# Patient Record
Sex: Male | Born: 1960
Health system: Southern US, Community
[De-identification: ages and names within clinical notes are randomized; demographics above are authoritative.]

## PROBLEM LIST (undated history)

## (undated) DIAGNOSIS — T7840XA Allergy, unspecified, initial encounter: Secondary | ICD-10-CM

## (undated) DIAGNOSIS — Z87442 Personal history of urinary calculi: Secondary | ICD-10-CM

## (undated) DIAGNOSIS — J45909 Unspecified asthma, uncomplicated: Secondary | ICD-10-CM

## (undated) DIAGNOSIS — S83419A Sprain of medial collateral ligament of unspecified knee, initial encounter: Secondary | ICD-10-CM

## (undated) DIAGNOSIS — K219 Gastro-esophageal reflux disease without esophagitis: Secondary | ICD-10-CM

## (undated) DIAGNOSIS — K429 Umbilical hernia without obstruction or gangrene: Secondary | ICD-10-CM

## (undated) DIAGNOSIS — R7611 Nonspecific reaction to tuberculin skin test without active tuberculosis: Secondary | ICD-10-CM

## (undated) HISTORY — DX: Unspecified asthma, uncomplicated: J45.909

## (undated) HISTORY — DX: Gastro-esophageal reflux disease without esophagitis: K21.9

## (undated) HISTORY — DX: Nonspecific reaction to tuberculin skin test without active tuberculosis: R76.11

## (undated) HISTORY — PX: HERNIA REPAIR: SHX51

## (undated) HISTORY — DX: Personal history of urinary calculi: Z87.442

## (undated) HISTORY — DX: Allergy, unspecified, initial encounter: T78.40XA

---

## 2010-10-22 HISTORY — PX: COLONOSCOPY: SHX174

## 2011-06-22 LAB — HM COLONOSCOPY

## 2016-12-06 ENCOUNTER — Telehealth: Payer: Self-pay | Admitting: Internal Medicine

## 2016-12-06 ENCOUNTER — Encounter: Payer: Self-pay | Admitting: Internal Medicine

## 2016-12-06 ENCOUNTER — Ambulatory Visit (INDEPENDENT_AMBULATORY_CARE_PROVIDER_SITE_OTHER): Payer: 59 | Admitting: Internal Medicine

## 2016-12-06 VITALS — BP 118/64 | HR 61 | Temp 97.7°F | Resp 12 | Ht 68.0 in | Wt 181.1 lb

## 2016-12-06 DIAGNOSIS — Z114 Encounter for screening for human immunodeficiency virus [HIV]: Secondary | ICD-10-CM

## 2016-12-06 DIAGNOSIS — Z1159 Encounter for screening for other viral diseases: Secondary | ICD-10-CM | POA: Diagnosis not present

## 2016-12-06 DIAGNOSIS — K429 Umbilical hernia without obstruction or gangrene: Secondary | ICD-10-CM

## 2016-12-06 DIAGNOSIS — Z0000 Encounter for general adult medical examination without abnormal findings: Secondary | ICD-10-CM

## 2016-12-06 DIAGNOSIS — K219 Gastro-esophageal reflux disease without esophagitis: Secondary | ICD-10-CM

## 2016-12-06 DIAGNOSIS — R1013 Epigastric pain: Secondary | ICD-10-CM

## 2016-12-06 LAB — CBC WITH DIFFERENTIAL/PLATELET
BASOS ABS: 0.1 10*3/uL (ref 0.0–0.1)
Basophils Relative: 1.3 % (ref 0.0–3.0)
Eosinophils Absolute: 0.4 10*3/uL (ref 0.0–0.7)
Eosinophils Relative: 6 % — ABNORMAL HIGH (ref 0.0–5.0)
HEMATOCRIT: 45.3 % (ref 39.0–52.0)
Hemoglobin: 15.6 g/dL (ref 13.0–17.0)
LYMPHS PCT: 28.7 % (ref 12.0–46.0)
Lymphs Abs: 1.9 10*3/uL (ref 0.7–4.0)
MCHC: 34.4 g/dL (ref 30.0–36.0)
MCV: 87.6 fl (ref 78.0–100.0)
MONOS PCT: 7.4 % (ref 3.0–12.0)
Monocytes Absolute: 0.5 10*3/uL (ref 0.1–1.0)
NEUTROS ABS: 3.8 10*3/uL (ref 1.4–7.7)
Neutrophils Relative %: 56.6 % (ref 43.0–77.0)
Platelets: 169 10*3/uL (ref 150.0–400.0)
RBC: 5.18 Mil/uL (ref 4.22–5.81)
RDW: 13.3 % (ref 11.5–15.5)
WBC: 6.6 10*3/uL (ref 4.0–10.5)

## 2016-12-06 LAB — LIPID PANEL
CHOL/HDL RATIO: 6
Cholesterol: 235 mg/dL — ABNORMAL HIGH (ref 0–200)
HDL: 40.2 mg/dL (ref >39.00)
LDL Cholesterol: 157 mg/dL — ABNORMAL HIGH (ref 0–99)
NONHDL: 194.83
TRIGLYCERIDES: 187 mg/dL — AB (ref 0.0–149.0)
VLDL: 37.4 mg/dL (ref 0.0–40.0)

## 2016-12-06 LAB — COMPREHENSIVE METABOLIC PANEL
ALT: 27 U/L (ref 0–53)
AST: 20 U/L (ref 0–37)
Albumin: 4.5 g/dL (ref 3.5–5.2)
Alkaline Phosphatase: 51 U/L (ref 39–117)
BILIRUBIN TOTAL: 1 mg/dL (ref 0.2–1.2)
BUN: 16 mg/dL (ref 6–23)
CALCIUM: 9.5 mg/dL (ref 8.4–10.5)
CO2: 30 meq/L (ref 19–32)
CREATININE: 1.17 mg/dL (ref 0.40–1.50)
Chloride: 104 mEq/L (ref 96–112)
GFR: 68.56 mL/min (ref >60.00)
Glucose, Bld: 97 mg/dL (ref 70–99)
Potassium: 4 mEq/L (ref 3.5–5.1)
Sodium: 139 mEq/L (ref 135–145)
TOTAL PROTEIN: 7.1 g/dL (ref 6.0–8.3)

## 2016-12-06 LAB — HEPATITIS C ANTIBODY: HCV AB: NEGATIVE

## 2016-12-06 LAB — TSH: TSH: 2.68 u[IU]/mL (ref 0.35–4.50)

## 2016-12-06 LAB — HIV ANTIBODY (ROUTINE TESTING W REFLEX): HIV: NONREACTIVE

## 2016-12-06 LAB — PSA: PSA: 1.2 ng/mL (ref 0.10–4.00)

## 2016-12-06 MED ORDER — PANTOPRAZOLE SODIUM 40 MG PO TBEC: 40.0000 mg | DELAYED_RELEASE_TABLET | Freq: Every day | ORAL | 6 refills | Status: DC

## 2016-12-06 MED ORDER — ALBUTEROL SULFATE HFA 108 (90 BASE) MCG/ACT IN AERS: 1.0000 | INHALATION_SPRAY | Freq: Four times a day (QID) | RESPIRATORY_TRACT | 6 refills | Status: DC | PRN

## 2016-12-06 MED ORDER — MONTELUKAST SODIUM 10 MG PO TABS: 10.0000 mg | ORAL_TABLET | Freq: Every evening | ORAL | 6 refills | Status: DC | PRN

## 2016-12-06 NOTE — Progress Notes (Signed)
Pre visit review using our clinic review tool, if applicable. No additional management support is needed unless otherwise documented below in the visit note. 

## 2016-12-06 NOTE — Telephone Encounter (Signed)
Patient stated that he forgot to ask Dr. Larose Kells for a refill of her albuterol and Singular 10mg  at his CPE appointment today. Please advise  Pharmacy: [facility]

## 2016-12-06 NOTE — Patient Instructions (Signed)
GO TO THE LAB : Get the blood work     GO TO THE FRONT DESK Schedule your next appointment for a   checkup in 6 months  Start Protonix 40 mg: 1 tablet before breakfast  Will refer you to a general surgeon  We'll get a ultrasound

## 2016-12-06 NOTE — Assessment & Plan Note (Addendum)
Immunization records:brough documentation to  HR, will call. Had a flu shot  Cscope 2010 wnl per pt  Prostate ca screening-  Last check 2010; DRE wnl today, check a PSA  Labs: CMP, FLP, CBC, TSH, PSA, HIV, hep C He is very active playing soccer-gym. Diet discussed, already improving x few months

## 2016-12-06 NOTE — Progress Notes (Signed)
Subjective:    Patient ID: Cole James, male    DOB: 03/04/1961, 56 y.o.   MRN: [id number]  DOS:  12/06/2016 Type of visit - description : New patient, CPX Interval history: Has a few concerns, see review of systems  Review of Systems  Long history of umbilical hernia, the last few weeks he feels that is a little harder to reduce. Mild discomfort. Would like to see a surgeon Also for a few months, on and off GERD symptoms slightly more noticeable, frequent heartburn. Denies odynophagia, weight loss, blood in the stools. Self started OTC Zantac and Maalox with some improvement. Also, postprandial bloating. Used to have to BMs daily, here lately just having one. Denies actual nausea, vomiting, weight loss, blood in the stools.  Constitutional: No fever. No chills. No unexplained wt changes. No unusual sweats  HEENT: No dental problems, no ear discharge, no facial swelling, no voice changes. No eye discharge, no eye  redness , no  intolerance to light   Respiratory: No wheezing , no  difficulty breathing. No cough , no mucus production  Cardiovascular: No CP, no leg swelling , no  Palpitations  GI: see above   Endocrine: No polyphagia, no polyuria , no polydipsia  GU: No dysuria, gross hematuria, difficulty urinating. No urinary urgency, no frequency.  Musculoskeletal: No joint swellings or unusual aches or pains  Skin: No change in the color of the skin, palor , no  Rash  Allergic, immunologic: No environmental allergies , no  food allergies  Neurological: No dizziness no  syncope. No headaches. No diplopia, no slurred, no slurred speech, no motor deficits, no facial  Numbness  Hematological: No enlarged lymph nodes, no easy bruising , no unusual bleedings  Psychiatry: No suicidal ideas, no hallucinations, no beavior problems, no confusion.  No unusual/severe anxiety, no depression    Past Medical History:  Diagnosis Date  . Allergy    pet dander, seasonal  .  Asthma    childhood  . GERD (gastroesophageal reflux disease)   . History of kidney stones    age 20  . 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 HERNIA REPAIR     inguinal, age 6   Family History  Problem Relation Age of Onset  . Hypertension Father   . Prostate cancer Maternal Grandfather   . Cancer Paternal Grandfather     cholangio Ca  . Colon cancer Neg Hx   . Diabetes Neg Hx   . CAD Neg Hx     Social History   Social History  . Marital status: Married    Spouse name: N/A  . Number of children: 2  . Years of education: N/A   Occupational History  . phyisician    Social History Main Topics  . Smoking status: Never Smoker  . Smokeless tobacco: Never Used  . Alcohol use Yes     Comment: Social  . Drug use: No  . Sexual activity: Not on file   Other Topics Concern  . Not on file   Social History Narrative   Born in [place], live in [place] since childhood   Moved to [place] 2017   Son 28   Daughter 57, [place] living w/ him   Soccer player       Allergies as of 12/06/2016   No Known Allergies     Medication List  Accurate as of 12/06/16 11:59 PM. Always use your most recent med list.          albuterol 108 (90 Base) MCG/ACT inhaler Commonly known as:  PROVENTIL HFA;VENTOLIN HFA Inhale 1-2 puffs into the lungs every 6 (six) hours as needed for wheezing or shortness of breath.   fexofenadine 30 MG tablet Commonly known as:  ALLEGRA Take 30 mg by mouth as needed.   montelukast 10 MG tablet Commonly known as:  SINGULAIR Take 1 tablet (10 mg total) by mouth at bedtime as needed.   pantoprazole 40 MG tablet Commonly known as:  PROTONIX Take 1 tablet (40 mg total) by mouth daily.          Objective:   Physical Exam BP 118/64 (BP Location: Right Arm, Patient Position: Sitting, Cuff Size: Normal)   Pulse 61   Temp 97.7 F (36.5 C) (Oral)   Resp 12   Ht 5\' 8"  (1.727 m)   Wt 181 lb 2  oz (82.2 kg)   SpO2 94%   BMI 27.54 kg/m   General:   Well developed, well nourished . NAD.  Neck: No  thyromegaly  HEENT:  Normocephalic . Face symmetric, atraumatic Lungs:  CTA B Normal respiratory effort, no intercostal retractions, no accessory muscle use. Heart: RRR,  no murmur.  No pretibial edema bilaterally  Abdomen:  Not distended, soft, non-tender. No rebound or rigidity. Has a 123456 inches umbilical hernia, mild, reducible, nontender. Rectal:  External abnormalities: none. Normal sphincter tone. No rectal masses or tenderness.  Stool brown  Prostate: Prostate gland firm and smooth, no enlargement; R side w/ a slt prominent proximal lobe but no mass or nodularity; no  tenderness, mass, asymmetry or induration.  Skin: Exposed areas without rash. Not pale. Not jaundice Neurologic:  alert & oriented X3.  Speech normal, gait appropriate for age and unassisted Strength symmetric and appropriate for age.  Psych: Cognition and judgment appear intact.  Cooperative with normal attention span and concentration.  Behavior appropriate. No anxious or depressed appearing.    Assessment & Plan:   Assessment GERD Asthma-mild , worse as a child, cold induced  Allergies  H/o +PPD H/o urolithiasis , age 70 2010--->  CP, elevated BP-- r/o MI, cardiac cath (-)  PLAN: GERD: On and off, worse lately, on Zantac and Maalox, already helping to some extent. Plan: Protonix, reassess 6 months Small umbilical hernia: Refer to surgery for further eval Dyspepsia: Postprandial bloating, could be GERD related or GB? , less likely d/t umbilical hernia. Will check a abdominal ultrasound RTC 6 months

## 2016-12-06 NOTE — Telephone Encounter (Signed)
Rxs sent

## 2016-12-07 DIAGNOSIS — K219 Gastro-esophageal reflux disease without esophagitis: Secondary | ICD-10-CM | POA: Insufficient documentation

## 2016-12-07 DIAGNOSIS — Z09 Encounter for follow-up examination after completed treatment for conditions other than malignant neoplasm: Secondary | ICD-10-CM | POA: Insufficient documentation

## 2016-12-07 MED FILL — VENTOLIN HFA 90 MCG INHALER: 108 (90 BAS | 25 days supply | Qty: 18 | Fill #0

## 2016-12-07 MED FILL — MONTELUKAST SOD 10 MG TAB: 10 | 90 days supply | Qty: 90 | Fill #0

## 2016-12-07 MED FILL — PANTOPRAZOLE SOD DR 40 MG T: 40 | 90 days supply | Qty: 90 | Fill #0

## 2016-12-07 NOTE — Assessment & Plan Note (Signed)
GERD: On and off, worse lately, on Zantac and Maalox, already helping to some extent. Plan: Protonix, reassess 6 months Small umbilical hernia: Refer to surgery for further eval Dyspepsia: Postprandial bloating, could be GERD related or GB? , less likely d/t umbilical hernia. Will check a abdominal ultrasound RTC 6 months

## 2016-12-11 ENCOUNTER — Ambulatory Visit (HOSPITAL_COMMUNITY): Admission: RE | Admit: 2016-12-11 | Payer: 59 | Source: Ambulatory Visit

## 2016-12-13 ENCOUNTER — Ambulatory Visit (HOSPITAL_COMMUNITY)
Admission: RE | Admit: 2016-12-13 | Discharge: 2016-12-13 | Disposition: A | Discharge status: Home / Self Care | Payer: 59 | Source: Ambulatory Visit | Attending: Internal Medicine | Admitting: Internal Medicine

## 2016-12-13 ENCOUNTER — Telehealth: Payer: Self-pay | Admitting: Internal Medicine

## 2016-12-13 DIAGNOSIS — R1013 Epigastric pain: Secondary | ICD-10-CM | POA: Diagnosis not present

## 2016-12-13 DIAGNOSIS — K30 Functional dyspepsia: Secondary | ICD-10-CM | POA: Diagnosis not present

## 2016-12-13 NOTE — Telephone Encounter (Signed)
Please advise? I see where US abdomen was completed.

## 2016-12-13 NOTE — Telephone Encounter (Signed)
Left a detailed message: Ultrasound was done for dyspepsia, it is essentially normal. Further evaluation of the hernia would be conducted by the surgeon throughout physical exam and if needed they could consider a CT.

## 2016-12-13 NOTE — Telephone Encounter (Signed)
Pt said that he was suppose to have a ultra sound at  [facility] for having a hernia. Pt says that they couldn't complete a ultrasound for the umbilical area because orders weren't placed. Pt would like to have those specific orders placed.     CB: [phone]

## 2016-12-19 ENCOUNTER — Ambulatory Visit: Payer: Self-pay | Admitting: General Surgery

## 2016-12-19 DIAGNOSIS — K429 Umbilical hernia without obstruction or gangrene: Secondary | ICD-10-CM | POA: Diagnosis not present

## 2016-12-19 NOTE — H&P (Signed)
Cole James 12/19/2016 10:36 AM Location: [facility] Patient #: [id number] DOB: [birth year] Married / Language: English / Race: Refused to Report/Unreported Male  History of Present Illness Cole Hollingshead MD; 12/19/2016 11:02 AM) The patient is a 56 year old male.   Note:He is referred by Dr.Paz for evaluation of a symptomatic umbilical hernia. He noted it for about one year but recently has become more symptomatic with respecttodiscomfort. He states in the past he felt a tearing like sensation. He also has noted more gas and borborygmi. He does have a history of gastroesophageal reflux disease. He states when he drinks a carbonated beverage and belches that helps the gas. He is also been placed on Protonix which helps some of this as well. He had a history of an inguinal hernia repair done many years ago with mesh.  No known bleeding disorders. No history in his family of a adverse anesthesia reaction.  Diagnostic Studies History Cole James, [place]; 12/19/2016 10:36 AM) Colonoscopy 5-10 years ago  Allergies Cole James, RMA; 12/19/2016 10:36 AM) No Known Allergies 12/19/2016  Medication History Cole James, [place]; 12/19/2016 10:37 AM) Pantoprazole Sodium (40MG  Tablet DR, Oral) Active. Albuterol (90MCG/ACT Aerosol Soln, Inhalation) Active. Allegra (30MG  Tablet, Oral) Active. Singulair (10MG  Tablet, Oral) Active. Medications Reconciled  Social History Cole James, [place]; 12/19/2016 10:36 AM) Alcohol use Occasional alcohol use. Caffeine use Coffee. No drug use Tobacco use Never smoker.  Family History Cole James, [place]; 12/19/2016 10:36 AM) Cancer Family Members In General. Hypertension Brother, Father. Prostate Cancer Family Members In General.  Other Problems Cole James, RMA; 12/19/2016 10:36 AM) Gastroesophageal Reflux Disease Hypercholesterolemia     Review of Systems Cole James RMA; 12/19/2016  10:36 AM) General Not Present- Appetite Loss, Chills, Fatigue, Fever, Night Sweats, Weight Gain and Weight Loss. Skin Not Present- Change in Wart/Mole, Dryness, Hives, Jaundice, New Lesions, Non-Healing Wounds, Rash and Ulcer. HEENT Present- Seasonal Allergies. Not Present- Earache, Hearing Loss, Hoarseness, Nose Bleed, Oral Ulcers, Ringing in the Ears, Sinus Pain, Sore Throat, Visual Disturbances, Wears glasses/contact lenses and Yellow Eyes. Respiratory Present- Snoring. Not Present- Bloody sputum, Chronic Cough, Difficulty Breathing and Wheezing. Breast Not Present- Breast Mass, Breast Pain, Nipple Discharge and Skin Changes. Cardiovascular Not Present- Chest Pain, Difficulty Breathing Lying Down, Leg Cramps, Palpitations, Rapid Heart Rate, Shortness of Breath and Swelling of Extremities. Gastrointestinal Present- Abdominal Pain and Change in Bowel Habits. Not Present- Bloating, Bloody Stool, Chronic diarrhea, Constipation, Difficulty Swallowing, Excessive gas, Gets full quickly at meals, Hemorrhoids, Indigestion, Nausea, Rectal Pain and Vomiting. Male Genitourinary Not Present- Blood in Urine, Change in Urinary Stream, Frequency, Impotence, Nocturia, Painful Urination, Urgency and Urine Leakage. Musculoskeletal Not Present- Back Pain, Joint Pain, Joint Stiffness, Muscle Pain, Muscle Weakness and Swelling of Extremities. Neurological Not Present- Decreased Memory, Fainting, Headaches, Numbness, Seizures, Tingling, Tremor, Trouble walking and Weakness. Psychiatric Not Present- Anxiety, Bipolar, Change in Sleep Pattern, Depression, Fearful and Frequent crying. Endocrine Not Present- Cold Intolerance, Excessive Hunger, Hair Changes, Heat Intolerance, Hot flashes and New Diabetes. Hematology Not Present- Blood Thinners, Easy Bruising, Excessive bleeding, Gland problems, HIV and Persistent Infections.  Vitals Cole James RMA; 12/19/2016 10:38 AM) 12/19/2016 10:37 AM Weight: 186.8 lb Height:  68in Body Surface Area: 1.99 m Body Mass Index: 28.4 kg/m  Temp.: 98.61F  Pulse: 65 (Regular)  BP: 118/70 (Sitting, Left Arm, Standard)      Physical Exam Cole Hollingshead MD; 12/19/2016 11:03 AM)  The physical exam findings are as follows: Note:GENERAL APPEARANCE: Overweight male in  NAD. Pleasant and cooperative.  EARS, NOSE, MOUTH THROAT: [place]/AT external ears: no lesions or deformities external nose: no lesions or deformities hearing: grossly normal lips: moist, no deformities EYES external: conjunctiva, lids, sclerae normal pupils: equal, round glasses: no  CV ascultation: RRR, no murmur extremity edema: no  RESP auscultation: breath sounds equal and clear respiratory effort: normal  GASTROINTESTINAL abdomen: Soft, non-tender, non-distended, no masses liver and spleen: not enlarged. hernia: Supraumbilical, reducible, 1.5 cm fascial defect scar: none present  MUSCULOSKELETAL station and gait: normal digits/nails: no clubbing or cyanosis instability: none  NEUROLOGIC speech: normal  PSYCHIATRIC alertness and orientation: normal mood/affect/behavior: normal judgement and insight: normal    Assessment & Plan Cole Hollingshead MD; [id number] AM)  UMBILICAL HERNIA WITHOUT OBSTRUCTION OR GANGRENE (K42.9) Impression: The hernia is symptomatic. He is having to manually reduce it.  Plan: I recommended open umbilical hernia repair with mesh. I have discussed the procedure, risks, and aftercare. Risks include but are not limited to bleeding, infection, wound healing problems, anesthesia, recurrence, injury to intra-abdominal organs. He seems to understand and would like to proceed.  [name], M.D.

## 2017-03-29 ENCOUNTER — Ambulatory Visit: Payer: Self-pay | Admitting: General Surgery

## 2017-04-04 ENCOUNTER — Encounter (HOSPITAL_BASED_OUTPATIENT_CLINIC_OR_DEPARTMENT_OTHER): Payer: Self-pay | Admitting: *Deleted

## 2017-04-04 ENCOUNTER — Ambulatory Visit: Payer: Self-pay

## 2017-04-04 ENCOUNTER — Ambulatory Visit (INDEPENDENT_AMBULATORY_CARE_PROVIDER_SITE_OTHER): Payer: 59 | Admitting: Family Medicine

## 2017-04-04 ENCOUNTER — Encounter: Payer: Self-pay | Admitting: Family Medicine

## 2017-04-04 VITALS — BP 126/84 | HR 62

## 2017-04-04 DIAGNOSIS — S83412A Sprain of medial collateral ligament of left knee, initial encounter: Secondary | ICD-10-CM | POA: Diagnosis not present

## 2017-04-04 DIAGNOSIS — M25562 Pain in left knee: Secondary | ICD-10-CM

## 2017-04-04 NOTE — Progress Notes (Signed)
[facility] [address] [place], [place] [address] Phone: [phone] Subjective:    I'm seeing this patient by the request  of:  Colon Branch, MD   CC: Knee pain  ATF:[id number]  ROBBI SCURLOCK is a 56 y.o. male coming in with complaint of knee pain. Left-sided Has had difficulty with his knees previously. Avid soccer player. Patient states was going down the stairs last week. Patient states that there is an audible pop. Next a had significant swelling. Describes the pain as more of a dull, throbbing aching pain. Has noticed lack of range of motion. Patient recently severity pain is 7 out of 10.     Past Medical History:  Diagnosis Date  . Allergy    pet dander, seasonal  . Asthma    childhood  . GERD (gastroesophageal reflux disease)   . History of kidney stones    age 67  . 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 HERNIA REPAIR     inguinal, age 67   Social History   Social History  . Marital status: Married    Spouse name: N/A  . Number of children: 2  . Years of education: N/A   Occupational History  . phyisician    Social History Main Topics  . Smoking status: Never Smoker  . Smokeless tobacco: Never Used  . Alcohol use Yes     Comment: Social  . Drug use: No  . Sexual activity: Not Asked   Other Topics Concern  . None   Social History Narrative   Born in [place], live in [place] since childhood   Moved to [place] 2017   Son 28   Daughter 71, [place] living w/ him   Soccer player    No Known Allergies Family History  Problem Relation Age of Onset  . Hypertension Father   . Prostate cancer Maternal Grandfather   . Cancer Paternal Grandfather        cholangio Ca  . Colon cancer Neg Hx   . Diabetes Neg Hx   . CAD Neg Hx     Past medical history, social, surgical and family history all reviewed in electronic medical record.  No pertanent information unless stated  regarding to the chief complaint.   Review of Systems:Review of systems updated and as accurate as of 04/04/17  No headache, visual changes, nausea, vomiting, diarrhea, constipation, dizziness, abdominal pain, skin rash, fevers, chills, night sweats, weight loss, swollen lymph nodes, body aches, muscle aches, chest pain, shortness of breath, mood changes. Positive joint swelling  Objective  Blood pressure 126/84, pulse 62, SpO2 94 %. Systems examined below as of 04/04/17   General: No apparent distress alert and oriented x3 mood and affect normal, dressed appropriately.  HEENT: Pupils equal, extraocular movements intact  Respiratory: Patient's speak in full sentences and does not appear short of breath  Cardiovascular: No lower extremity edema, non tender, no erythema  Skin: Warm dry intact with no signs of infection or rash on extremities or on axial skeleton.  Abdomen: Soft nontender  Neuro: Cranial nerves II through XII are intact, neurovascularly intact in all extremities with 2+ DTRs and 2+ pulses.  Lymph: No lymphadenopathy of posterior or anterior cervical chain or axillae bilaterally.  Gait Mild antalgic gait MSK:  Non tender with full range of motion and good stability and symmetric strength and tone of  shoulders, elbows, wrist, hip, and ankles bilaterally.  Knee: Left Effusion noted Diffuse tenderness Lacks last 15 of flexion More pain with stressing the MCL Negative Mcmurray's, Apley's, and Thessalonian tests. Non painful patellar compression. Patellar glide without crepitus. Patellar and quadriceps tendons unremarkable. Hamstring and quadriceps strength is normal.  Contralateral knee unremarkable  MSK US performed of: Left This study was ordered, performed, and interpreted by Charlann Boxer D.O.  Knee: Large effusion noted Anteromedial, anterolateral, posteromedial, and posterolateral menisci unremarkable without tearing, fraying, effusion, or displacement. Patellar  Tendon unremarkable on long and transverse views without effusion. No abnormality of prepatellar bursa. MCL does have a greater than 50% tear off of the femoral component  IMPRESSION:  Partial possible high-grade tear of the MCL with effusion  Procedure: Real-time Ultrasound Guided Injection of left knee Device: GE Logiq Q7 Ultrasound guided injection is preferred based studies that show increased duration, increased effect, greater accuracy, decreased procedural pain, increased response rate, and decreased cost with ultrasound guided versus blind injection.  Verbal informed consent obtained.  Time-out conducted.  Noted no overlying erythema, induration, or other signs of local infection.  Skin prepped in a sterile fashion.  Local anesthesia: Topical Ethyl chloride.  With sterile technique and under real time ultrasound guidance: With a 22-gauge 2 inch needle patient was injected with 4 cc  0.5% Marcaine then aspirated 35 mL of strawlike fluid and injected 1 cc of Kenalog 40 mg/dL. This was from a superior lateral approach.  Completed without difficulty  Pain immediately resolved suggesting accurate placement of the medication.  Advised to call if fevers/chills, erythema, induration, drainage, or persistent bleeding.  Images permanently stored and available for review in the ultrasound unit.  Impression: Technically successful ultrasound guided injection.    Impression and Recommendations:     This case required medical decision making of moderate complexity.      Note: This dictation was prepared with Dragon dictation along with smaller phrase technology. Any transcriptional errors that result from this process are unintentional.

## 2017-04-04 NOTE — Patient Instructions (Addendum)
Good to see you.  Ice 20 minutes 2 times daily. Usually after activity and before bed. pennsaid pinkie amount topically 2 times daily as needed.  Wear brace daily for 2 weeks.  Do not wear it at night Give em an update on Monday by text  Likely will take 4-6 weeks. We will need to look at it again when you feel good after your surgery  [phone]

## 2017-04-04 NOTE — Progress Notes (Signed)
Pt instructed npo p mn 6/20 x allergy meds w sip of water if needed.  To [facility] 6/21 @ 0930.  hgb on arrival.  Pt instructed to do hibiclens shower hs and am of surgery.  Pt verbalized his under standing.

## 2017-04-04 NOTE — Assessment & Plan Note (Signed)
Partial tear noted. Patient did have a large effusion. Brace given. Discussed icing regimen and given topical anti-inflammatories. Patient is going to be having a hernia repair in the near future so we'll not start exercises until he is feeling better. Follow-up again with me in 3-4 weeks.

## 2017-04-11 ENCOUNTER — Ambulatory Visit (HOSPITAL_BASED_OUTPATIENT_CLINIC_OR_DEPARTMENT_OTHER)
Admission: RE | Admit: 2017-04-11 | Discharge: 2017-04-11 | Disposition: A | Discharge status: Home / Self Care | Payer: 59 | Source: Ambulatory Visit | Attending: General Surgery | Admitting: General Surgery

## 2017-04-11 ENCOUNTER — Ambulatory Visit (HOSPITAL_BASED_OUTPATIENT_CLINIC_OR_DEPARTMENT_OTHER): Payer: 59 | Admitting: Anesthesiology

## 2017-04-11 ENCOUNTER — Encounter (HOSPITAL_BASED_OUTPATIENT_CLINIC_OR_DEPARTMENT_OTHER): Payer: Self-pay | Admitting: *Deleted

## 2017-04-11 ENCOUNTER — Encounter (HOSPITAL_BASED_OUTPATIENT_CLINIC_OR_DEPARTMENT_OTHER)
Admission: RE | Disposition: A | Discharge status: Home / Self Care | Payer: Self-pay | Source: Ambulatory Visit | Attending: General Surgery

## 2017-04-11 DIAGNOSIS — Z8042 Family history of malignant neoplasm of prostate: Secondary | ICD-10-CM | POA: Insufficient documentation

## 2017-04-11 DIAGNOSIS — R7611 Nonspecific reaction to tuberculin skin test without active tuberculosis: Secondary | ICD-10-CM | POA: Diagnosis not present

## 2017-04-11 DIAGNOSIS — Z91040 Latex allergy status: Secondary | ICD-10-CM | POA: Insufficient documentation

## 2017-04-11 DIAGNOSIS — Z79899 Other long term (current) drug therapy: Secondary | ICD-10-CM | POA: Insufficient documentation

## 2017-04-11 DIAGNOSIS — J3081 Allergic rhinitis due to animal (cat) (dog) hair and dander: Secondary | ICD-10-CM | POA: Insufficient documentation

## 2017-04-11 DIAGNOSIS — K429 Umbilical hernia without obstruction or gangrene: Secondary | ICD-10-CM | POA: Diagnosis not present

## 2017-04-11 DIAGNOSIS — K219 Gastro-esophageal reflux disease without esophagitis: Secondary | ICD-10-CM | POA: Diagnosis not present

## 2017-04-11 DIAGNOSIS — Z87442 Personal history of urinary calculi: Secondary | ICD-10-CM | POA: Diagnosis not present

## 2017-04-11 DIAGNOSIS — Z8249 Family history of ischemic heart disease and other diseases of the circulatory system: Secondary | ICD-10-CM | POA: Insufficient documentation

## 2017-04-11 DIAGNOSIS — Z800 Family history of malignant neoplasm of digestive organs: Secondary | ICD-10-CM | POA: Insufficient documentation

## 2017-04-11 HISTORY — PX: INSERTION OF MESH: SHX5868

## 2017-04-11 HISTORY — PX: UMBILICAL HERNIA REPAIR: SHX196

## 2017-04-11 HISTORY — DX: Umbilical hernia without obstruction or gangrene: K42.9

## 2017-04-11 HISTORY — DX: Sprain of medial collateral ligament of unspecified knee, initial encounter: S83.419A

## 2017-04-11 LAB — POCT HEMOGLOBIN-HEMACUE: Hemoglobin: 15.9 g/dL (ref 13.0–17.0)

## 2017-04-11 SURGERY — REPAIR, HERNIA, UMBILICAL, ADULT
Anesthesia: General

## 2017-04-11 MED ORDER — FENTANYL CITRATE (PF) 100 MCG/2ML IJ SOLN
INTRAMUSCULAR | Status: AC
  Filled 2017-04-11: qty 2

## 2017-04-11 MED ORDER — LACTATED RINGERS IV SOLN
INTRAVENOUS | Status: DC
  Administered 2017-04-11 (×2): via INTRAVENOUS
  Filled 2017-04-11: qty 1000

## 2017-04-11 MED ORDER — SUGAMMADEX SODIUM 200 MG/2ML IV SOLN
INTRAVENOUS | Status: DC | PRN
  Administered 2017-04-11: 200 mg via INTRAVENOUS

## 2017-04-11 MED ORDER — DEXAMETHASONE SODIUM PHOSPHATE 10 MG/ML IJ SOLN
INTRAMUSCULAR | Status: AC
  Filled 2017-04-11: qty 1

## 2017-04-11 MED ORDER — SUGAMMADEX SODIUM 200 MG/2ML IV SOLN
INTRAVENOUS | Status: AC
  Filled 2017-04-11: qty 2

## 2017-04-11 MED ORDER — KETOROLAC TROMETHAMINE 30 MG/ML IJ SOLN
INTRAMUSCULAR | Status: DC | PRN
  Administered 2017-04-11: 30 mg via INTRAVENOUS

## 2017-04-11 MED ORDER — LIDOCAINE HCL (CARDIAC) 20 MG/ML IV SOLN
INTRAVENOUS | Status: DC | PRN
  Administered 2017-04-11: 100 mg via INTRAVENOUS

## 2017-04-11 MED ORDER — OXYCODONE HCL 5 MG PO TABS
ORAL_TABLET | ORAL | Status: AC
  Filled 2017-04-11: qty 1

## 2017-04-11 MED ORDER — CHLORHEXIDINE GLUCONATE CLOTH 2 % EX PADS
6.0000 | MEDICATED_PAD | Freq: Once | CUTANEOUS | Status: DC
  Filled 2017-04-11: qty 6

## 2017-04-11 MED ORDER — CEFAZOLIN SODIUM-DEXTROSE 2-4 GM/100ML-% IV SOLN
INTRAVENOUS | Status: AC
Start: 2017-04-11 — End: 2017-04-11
  Filled 2017-04-11: qty 100

## 2017-04-11 MED ORDER — PROPOFOL 10 MG/ML IV BOLUS
INTRAVENOUS | Status: AC
  Filled 2017-04-11: qty 40

## 2017-04-11 MED ORDER — BUPIVACAINE HCL (PF) 0.25 % IJ SOLN
INTRAMUSCULAR | Status: DC | PRN
  Administered 2017-04-11: 16 mL

## 2017-04-11 MED ORDER — OXYCODONE HCL 5 MG PO TABS: 5.0000 mg | ORAL_TABLET | ORAL | 0 refills | Status: DC | PRN

## 2017-04-11 MED ORDER — FENTANYL CITRATE (PF) 100 MCG/2ML IJ SOLN
25.0000 ug | INTRAMUSCULAR | Status: DC | PRN
  Filled 2017-04-11: qty 1

## 2017-04-11 MED ORDER — LACTATED RINGERS IV SOLN
INTRAVENOUS | Status: DC
  Filled 2017-04-11: qty 1000

## 2017-04-11 MED ORDER — ROCURONIUM BROMIDE 50 MG/5ML IV SOSY
PREFILLED_SYRINGE | INTRAVENOUS | Status: AC
  Filled 2017-04-11: qty 5

## 2017-04-11 MED ORDER — CEFAZOLIN SODIUM-DEXTROSE 2-4 GM/100ML-% IV SOLN
2.0000 g | INTRAVENOUS | Status: AC
  Administered 2017-04-11: 2 g via INTRAVENOUS
  Filled 2017-04-11: qty 100

## 2017-04-11 MED ORDER — DEXAMETHASONE SODIUM PHOSPHATE 4 MG/ML IJ SOLN
INTRAMUSCULAR | Status: DC | PRN
  Administered 2017-04-11: 10 mg via INTRAVENOUS

## 2017-04-11 MED ORDER — ACETAMINOPHEN 325 MG PO TABS
650.0000 mg | ORAL_TABLET | ORAL | Status: DC | PRN
  Filled 2017-04-11: qty 2

## 2017-04-11 MED ORDER — ACETAMINOPHEN 650 MG RE SUPP
650.0000 mg | RECTAL | Status: DC | PRN
  Filled 2017-04-11: qty 1

## 2017-04-11 MED ORDER — OXYCODONE HCL 5 MG PO TABS
5.0000 mg | ORAL_TABLET | ORAL | Status: DC | PRN
  Administered 2017-04-11: 5 mg via ORAL
  Filled 2017-04-11: qty 2

## 2017-04-11 MED ORDER — ONDANSETRON HCL 4 MG/2ML IJ SOLN
INTRAMUSCULAR | Status: DC | PRN
  Administered 2017-04-11: 4 mg via INTRAVENOUS

## 2017-04-11 MED ORDER — LIDOCAINE 2% (20 MG/ML) 5 ML SYRINGE
INTRAMUSCULAR | Status: AC
Start: 2017-04-11 — End: 2017-04-11
  Filled 2017-04-11: qty 5

## 2017-04-11 MED ORDER — METOCLOPRAMIDE HCL 5 MG/ML IJ SOLN
10.0000 mg | Freq: Once | INTRAMUSCULAR | Status: DC | PRN
  Filled 2017-04-11: qty 2

## 2017-04-11 MED ORDER — PROPOFOL 10 MG/ML IV BOLUS
INTRAVENOUS | Status: DC | PRN
  Administered 2017-04-11: 180 mg via INTRAVENOUS

## 2017-04-11 MED ORDER — MIDAZOLAM HCL 2 MG/2ML IJ SOLN
INTRAMUSCULAR | Status: AC
  Filled 2017-04-11: qty 2

## 2017-04-11 MED ORDER — SODIUM CHLORIDE 0.9% FLUSH
3.0000 mL | INTRAVENOUS | Status: DC | PRN
  Filled 2017-04-11: qty 3

## 2017-04-11 MED ORDER — FENTANYL CITRATE (PF) 100 MCG/2ML IJ SOLN
INTRAMUSCULAR | Status: DC | PRN
Start: 2017-04-11 — End: 2017-04-11
  Administered 2017-04-11 (×3): 25 ug via INTRAVENOUS
  Administered 2017-04-11: 50 ug via INTRAVENOUS
  Administered 2017-04-11 (×3): 25 ug via INTRAVENOUS

## 2017-04-11 MED ORDER — MIDAZOLAM HCL 5 MG/5ML IJ SOLN
INTRAMUSCULAR | Status: DC | PRN
  Administered 2017-04-11: 2 mg via INTRAVENOUS

## 2017-04-11 MED ORDER — ROCURONIUM BROMIDE 100 MG/10ML IV SOLN
INTRAVENOUS | Status: DC | PRN
  Administered 2017-04-11: 50 mg via INTRAVENOUS

## 2017-04-11 MED ORDER — MORPHINE SULFATE (PF) 2 MG/ML IV SOLN
2.0000 mg | INTRAVENOUS | Status: DC | PRN
  Filled 2017-04-11: qty 2

## 2017-04-11 MED ORDER — ONDANSETRON HCL 4 MG/2ML IJ SOLN
INTRAMUSCULAR | Status: AC
  Filled 2017-04-11: qty 2

## 2017-04-11 MED ORDER — MEPERIDINE HCL 25 MG/ML IJ SOLN
6.2500 mg | INTRAMUSCULAR | Status: DC | PRN
  Filled 2017-04-11: qty 1

## 2017-04-11 MED FILL — oxyCODONE HCL 5 MG TABS: 5 | 3 days supply | Qty: 40 | Fill #0

## 2017-04-11 SURGICAL SUPPLY — 53 items
APPLICATOR COTTON TIP 6IN STRL (MISCELLANEOUS) IMPLANT
BENZOIN TINCTURE PRP APPL 2/3 (GAUZE/BANDAGES/DRESSINGS) ×2 IMPLANT
BLADE CLIPPER SURG (BLADE) ×2 IMPLANT
BLADE SURG 10 STRL SS (BLADE) IMPLANT
BLADE SURG 15 STRL LF DISP TIS (BLADE) ×1 IMPLANT
BLADE SURG 15 STRL SS (BLADE) ×1
CANISTER SUCTION 1200CC (MISCELLANEOUS) IMPLANT
CLEANER CAUTERY TIP 5X5 PAD (MISCELLANEOUS) ×1 IMPLANT
CLOTH BEACON ORANGE TIMEOUT ST (SAFETY) ×2 IMPLANT
COVER BACK TABLE 60X90IN (DRAPES) ×2 IMPLANT
COVER MAYO STAND STRL (DRAPES) ×2 IMPLANT
DISSECTOR ROUND CHERRY 3/8 STR (MISCELLANEOUS) IMPLANT
DRAIN PENROSE 18X1/2 LTX STRL (DRAIN) IMPLANT
DRAPE INCISE IOBAN 66X45 STRL (DRAPES) ×2 IMPLANT
DRAPE LAPAROTOMY 100X72 PEDS (DRAPES) ×2 IMPLANT
DRESSING TELFA ISLAND 4X8 (GAUZE/BANDAGES/DRESSINGS) ×2 IMPLANT
DRSG TEGADERM 4X4.75 (GAUZE/BANDAGES/DRESSINGS) ×2 IMPLANT
DRSG TELFA 3X8 NADH (GAUZE/BANDAGES/DRESSINGS) ×2 IMPLANT
ELECT REM PT RETURN 9FT ADLT (ELECTROSURGICAL) ×2
ELECTRODE REM PT RTRN 9FT ADLT (ELECTROSURGICAL) ×1 IMPLANT
GAUZE SPONGE 4X4 12PLY STRL LF (GAUZE/BANDAGES/DRESSINGS) ×2 IMPLANT
GLOVE ECLIPSE 8.0 STRL XLNG CF (GLOVE) ×2 IMPLANT
GLOVE INDICATOR 8.0 STRL GRN (GLOVE) ×2 IMPLANT
GOWN W/2 COTTON TOWELS 2 STD (GOWNS) ×2 IMPLANT
KIT RM TURNOVER CYSTO AR (KITS) ×2 IMPLANT
MANIFOLD NEPTUNE II (INSTRUMENTS) IMPLANT
MESH HERNIA 3X6 (Mesh General) ×2 IMPLANT
NDL SUT 6 .5 CRC .975X.05 MAYO (NEEDLE) IMPLANT
NEEDLE HYPO 25X1 1.5 SAFETY (NEEDLE) ×2 IMPLANT
NEEDLE MAYO TAPER (NEEDLE)
NOVOVIL 0 ×2 IMPLANT
NS IRRIG 500ML POUR BTL (IV SOLUTION) ×2 IMPLANT
PACK BASIN DAY SURGERY FS (CUSTOM PROCEDURE TRAY) IMPLANT
PAD CLEANER CAUTERY TIP 5X5 (MISCELLANEOUS) ×1
PAD PREP 24X48 CUFFED NSTRL (MISCELLANEOUS) IMPLANT
PENCIL BUTTON HOLSTER BLD 10FT (ELECTRODE) ×2 IMPLANT
STRIP CLOSURE SKIN 1/2X4 (GAUZE/BANDAGES/DRESSINGS) ×2 IMPLANT
SUT MON AB 4-0 PC3 18 (SUTURE) ×2 IMPLANT
SUT NOVA 0 T19/GS 22DT (SUTURE) ×2 IMPLANT
SUT PROLENE 0 CT 1 30 (SUTURE) ×2 IMPLANT
SUT PROLENE 2 0 CT2 30 (SUTURE) IMPLANT
SUT SURG 0 T 19/GS 22 1969 62 (SUTURE) IMPLANT
SUT VIC AB 2-0 SH 27 (SUTURE) ×1
SUT VIC AB 2-0 SH 27XBRD (SUTURE) ×1 IMPLANT
SUT VIC AB 3-0 SH 27 (SUTURE) ×1
SUT VIC AB 3-0 SH 27X BRD (SUTURE) ×1 IMPLANT
SYR CONTROL 10ML LL (SYRINGE) ×2 IMPLANT
TAPE STRIPS DRAPE STRL (GAUZE/BANDAGES/DRESSINGS) ×2 IMPLANT
TOWEL OR 17X24 6PK STRL BLUE (TOWEL DISPOSABLE) ×4 IMPLANT
TRAY DSU PREP LF (CUSTOM PROCEDURE TRAY) IMPLANT
TUBE CONNECTING 12X1/4 (SUCTIONS) IMPLANT
WATER STERILE IRR 500ML POUR (IV SOLUTION) IMPLANT
YANKAUER SUCT BULB TIP NO VENT (SUCTIONS) ×2 IMPLANT

## 2017-04-11 NOTE — Op Note (Signed)
OPERATIVE NOTE- UMBILICAL HERNIA REPAIR  Preoperative diagnosis: Umbilical hernia  Postoperative diagnosis: Same  Procedure: Umbilical hernia repair with mesh.  Surgeon: [name] M.D.  Anesthesia: General plus Marcaine local  EBL:  50 cc   Indication: This is a 56 year old male with a symptomatic umbilical hernia. He now presents for elective repair.  Technique: He was seen in the holding room and then brought to the operating room, placed supine on the operating table, and a general anesthetic was given.  The hair in the periumbilical area was clipped as was necessary.  The periumbilical area was sterilely prepped and draped. A timeout was performed.  Marcaine solution was infiltrated superficially and deep in the periumbilical area. A subumbilical transverse incision was made through the skin and subcutaneous tissue until the fascia was identified. The subcutaneous tissue was mobilized free from the fascia inferiorly and laterally. The umbilical stalk was mobilized and dissected free from the fascia exposing the underlying hernia defect. The hernia sac and tissue within it were reduced back into the abdominal cavity.  The subcutaneous tissue was freed from the fascia for 3-4 cm around the primary defect. The primary defect was then closed with interrupted 0 Novofil sutures. The sutures were left long.  A piece of polypropylene mesh was brought into the field. The primary repair sutures were threaded up through the mesh and tied down anchoring the mesh directly over the primary repair. The periphery of the mesh was then anchored to the fascia with a running 0 Prolene suture to allow for 3-4 cm of mesh overlap. The excess mesh was trimmed and removed.  Hemostasis was adequate at this time. The umbilicus was reimplanted onto the mesh and fascia with 3-0 Vicryl suture. The subcutaneous tissue was closed over the mesh with a running 3-0 Vicryl suture. The skin was closed with a 4-0  Monocryl subcuticular stitch. Steri-Strips and sterile dressings were applied.  He tolerated the procedure well without any apparent complications and was taken to the recovery room in satisfactory condition.

## 2017-04-11 NOTE — Transfer of Care (Signed)
Immediate Anesthesia Transfer of Care Note  Patient: Cole James  Procedure(s) Performed: Procedure(s) (LRB): UMBILICAL HERNIA REPAIR (N/A) INSERTION OF MESH (N/A)  Patient Location: PACU  Anesthesia Type: General  Level of Consciousness: awake, sedated, patient cooperative and responds to stimulation  Airway & Oxygen Therapy: Patient Spontanous Breathing and Patient connected to face mask oxygen  Post-op Assessment: Report given to PACU RN, Post -op Vital signs reviewed and stable and Patient moving all extremities  Post vital signs: Reviewed and stable  Complications: No apparent anesthesia complications

## 2017-04-11 NOTE — Interval H&P Note (Signed)
History and Physical Interval Note:  04/11/2017 10:11 AM  Cole James  has presented today for surgery, with the diagnosis of Symptomatic umbilical hernia  The various methods of treatment have been discussed with the patient and family. After consideration of risks, benefits and other options for treatment, the patient has consented to  Procedure(s): UMBILICAL HERNIA REPAIR (N/A) INSERTION OF MESH (N/A) as a surgical intervention .  The patient's history has been reviewed, patient examined, no change in status, stable for surgery.  I have reviewed the patient's chart and labs.  Questions were answered to the patient's satisfaction.     Shanell Aden Lenna Sciara

## 2017-04-11 NOTE — H&P (Signed)
Cole James is an 56 y.o. male.   Chief Complaint:  Here for elective surgery HPI:  This is a 56 year old male who has a symptomatic umbilical hernia. He now presents for elective repair. He has had a recent MCL injury but other than that, no changes in his medical history.   Past Medical History:  Diagnosis Date  . Allergy    pet dander, seasonal  . Asthma    childhood  . GERD (gastroesophageal reflux disease)   . History of kidney stones    age 28  . 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 Tear of MCL (medial collateral ligament) of knee    fluid aspiration 04/04/17  . Umbilical hernia     Past Surgical History:  Procedure Laterality Date  . HERNIA REPAIR     inguinal, age 26    Family History  Problem Relation Age of Onset  . Hypertension Father   . Prostate cancer Maternal Grandfather   . Cancer Paternal Grandfather        cholangio Ca  . Colon cancer Neg Hx   . Diabetes Neg Hx   . CAD Neg Hx    Social History:  reports that he has never smoked. He has never used smokeless tobacco. He reports that he drinks about 2.4 oz of alcohol per week . He reports that he does not use drugs.  Allergies:  Allergies  Allergen Reactions  . Latex Dermatitis    Medications Prior to Admission  Medication Sig Dispense Refill  . fexofenadine (ALLEGRA) 30 MG tablet Take 30 mg by mouth as needed.    Marland Kitchen albuterol (PROVENTIL HFA;VENTOLIN HFA) 108 (90 Base) MCG/ACT inhaler Inhale 1-2 puffs into the lungs every 6 (six) hours as needed for wheezing or shortness of breath. 18 g 6  . montelukast (SINGULAIR) 10 MG tablet Take 10 mg by mouth at bedtime as needed.      No results found for this or any previous visit (from the past 48 hour(s)). No results found.  Review of Systems  Constitutional: Negative for chills and fever.    Blood pressure 129/87, pulse 61, temperature 97.5 F (36.4 C), temperature source Oral, resp. rate 16, height 5\' 8"  (1.727 m), weight 83.9 kg (185 lb), SpO2 97  %. Physical Exam  Constitutional:  Overweight male in no acute distress.  Eyes: EOM are normal. No scleral icterus.  Cardiovascular: Normal rate and regular rhythm.   Respiratory: Effort normal and breath sounds normal.  GI: Soft. There is no tenderness.  Reducible umbilical hernia.  Musculoskeletal: He exhibits no edema.  Neurological: He is alert.  Skin: Skin is warm and dry.  Psychiatric: He has a normal mood and affect. His behavior is normal.     Assessment/Plan Symptomatic umbilical hernia.  Plan: Umbilical hernia repair with mesh. The procedure, risks, and after care had been discussed with him previously.  [name], MD 04/11/2017, 9:59 AM

## 2017-04-11 NOTE — Anesthesia Postprocedure Evaluation (Signed)
Anesthesia Post Note  Patient: Cole James  Procedure(s) Performed: Procedure(s) (LRB): UMBILICAL HERNIA REPAIR (N/A) INSERTION OF MESH (N/A)     Patient location during evaluation: PACU Anesthesia Type: General Level of consciousness: sedated Pain management: pain level controlled Vital Signs Assessment: post-procedure vital signs reviewed and stable Respiratory status: spontaneous breathing and respiratory function stable Cardiovascular status: stable Anesthetic complications: no    Last Vitals:  Vitals:   04/11/17 1230 04/11/17 1245  BP: 131/82 124/86  Pulse: 66 (!) 58  Resp: 13 11  Temp:      Last Pain:  Vitals:   04/11/17 1229  TempSrc:   PainSc: 7                  Tanayia Wahlquist DANIEL

## 2017-04-11 NOTE — Anesthesia Procedure Notes (Signed)
Procedure Name: Intubation Date/Time: 04/11/2017 10:23 AM Performed by: Justice Rocher Pre-anesthesia Checklist: Patient identified, Emergency Drugs available, Suction available and Patient being monitored Patient Re-evaluated:Patient Re-evaluated prior to inductionOxygen Delivery Method: Circle system utilized Preoxygenation: Pre-oxygenation with 100% oxygen Intubation Type: IV induction Ventilation: Mask ventilation without difficulty Tube type: Oral Tube size: 8.0 mm Number of attempts: 1 Airway Equipment and Method: Stylet and Oral airway Placement Confirmation: ETT inserted through vocal cords under direct vision,  positive ETCO2 and breath sounds checked- equal and bilateral Secured at: 20 cm Tube secured with: Tape Dental Injury: Teeth and Oropharynx as per pre-operative assessment

## 2017-04-11 NOTE — Anesthesia Preprocedure Evaluation (Signed)
Anesthesia Evaluation  Patient identified by MRN, date of birth, ID band Patient awake    Reviewed: Allergy & Precautions, NPO status , Patient's Chart, lab work & pertinent test results  Airway Mallampati: II  TM Distance: >3 FB Neck ROM: Full    Dental no notable dental hx.    Pulmonary neg pulmonary ROS,    Pulmonary exam normal breath sounds clear to auscultation       Cardiovascular negative cardio ROS Normal cardiovascular exam Rhythm:Regular Rate:Normal     Neuro/Psych negative neurological ROS  negative psych ROS   GI/Hepatic Neg liver ROS, GERD  ,  Endo/Other  negative endocrine ROS  Renal/GU negative Renal ROS  negative genitourinary   Musculoskeletal negative musculoskeletal ROS (+)   Abdominal   Peds negative pediatric ROS (+)  Hematology negative hematology ROS (+)   Anesthesia Other Findings   Reproductive/Obstetrics negative OB ROS                             Anesthesia Physical Anesthesia Plan  ASA: II  Anesthesia Plan: General   Post-op Pain Management:    Induction: Intravenous  PONV Risk Score and Plan: 2 and Ondansetron and Dexamethasone  Airway Management Planned: Oral ETT  Additional Equipment:   Intra-op Plan:   Post-operative Plan: Extubation in OR  Informed Consent: I have reviewed the patients History and Physical, chart, labs and discussed the procedure including the risks, benefits and alternatives for the proposed anesthesia with the patient or authorized representative who has indicated his/her understanding and acceptance.   Dental advisory given  Plan Discussed with: CRNA  Anesthesia Plan Comments:         Anesthesia Quick Evaluation

## 2017-04-11 NOTE — Discharge Instructions (Addendum)
Post Anesthesia Home Care Instructions  Activity: Get plenty of rest for the remainder of the day. A responsible individual must stay with you for 24 hours following the procedure.  For the next 24 hours, DO NOT: -Drive a car -Paediatric nurse -Drink alcoholic beverages -Take any medication unless instructed by your physician -Make any legal decisions or sign important papers.  Meals: Start with liquid foods such as gelatin or soup. Progress to regular foods as tolerated. Avoid greasy, spicy, heavy foods. If nausea and/or vomiting occur, drink only clear liquids until the nausea and/or vomiting subsides. Call your physician if vomiting continues.  Special Instructions/Symptoms: Your throat may feel dry or sore from the anesthesia or the breathing tube placed in your throat during surgery. If this causes discomfort, gargle with warm salt water. The discomfort should disappear within 24 hours.  If you had a scopolamine patch placed behind your ear for the management of post- operative nausea and/or vomiting:  1. The medication in the patch is effective for 72 hours, after which it should be removed.  Wrap patch in a tissue and discard in the trash. Wash hands thoroughly with soap and water. 2. You may remove the patch earlier than 72 hours if you experience unpleasant side effects which may include dry mouth, dizziness or visual disturbances. 3. Avoid touching the patch. Wash your hands with soap and water after contact with the patch.   CCS _______[facility]   UMBILICAL HERNIA REPAIR: POST OP INSTRUCTIONS  Always review your discharge instruction sheet given to you by the facility where your surgery was performed. IF YOU HAVE DISABILITY OR FAMILY LEAVE FORMS, YOU MUST BRING THEM TO THE OFFICE FOR PROCESSING.   DO NOT GIVE THEM TO YOUR DOCTOR.  1. A  prescription for pain medication may be given to you upon discharge.  Take your pain medication as prescribed, if  needed.  If narcotic pain medicine is not needed, then you may take acetaminophen (Tylenol) or ibuprofen (Advil) as needed. 2. Take your usually prescribed medications unless otherwise directed. 3. If you need a refill on your pain medication, please contact your pharmacy.  They will contact our office to request authorization. Prescriptions will not be filled after 5 pm or on week-ends. 4. You should follow a light diet the first 24 hours after arrival home, such as soup and crackers, etc.  Be sure to include lots of fluids daily.  Resume your normal diet the day after surgery. 5. Most patients will experience some swelling and bruising around the umbilicus (belly button).  Ice packs and reclining will help.  Swelling and bruising can take many days to resolve.  6. It is common to experience some constipation if taking pain medication after surgery.  Increasing fluid intake and taking a stool softener (such as Colace) will usually help or prevent this problem from occurring.  A mild laxative (Milk of Magnesia or Miralax) should be taken according to package directions if there are no bowel movements after 48 hours. 7. Unless discharge instructions indicate otherwise, you may remove your bandages 72 hours after surgery, and you may shower at that time.  You may have steri-strips (small skin tapes) in place directly over the incision.  These strips should be left on the skin.  If your surgeon used skin glue on the incision, you may shower in 24 hours.  The glue will flake off over the next 2-3 weeks.  Any sutures or staples will be removed at the  office during your follow-up visit. 8. ACTIVITIES:  You may resume regular (light) daily activities beginning the next day--such as daily self-care, walking, climbing stairs--gradually increasing activities as tolerated.  You may have sexual intercourse when it is comfortable.  Refrain from any heavy lifting or straining-nothing over 10 pounds for 6 weeks.  a. You  may drive when you are no longer taking prescription pain medication, you can comfortably wear a seatbelt, and you can safely maneuver your car and apply brakes. b. RETURN TO WORK:  Desk work/Light work in 5-7 days when you are pain-free, full duty in 6 weeks._________________________________________________________ 9. You should see your doctor in the office for a follow-up appointment approximately 2-3 weeks after your surgery.  Make sure that you call for this appointment within a day or two after you arrive home to insure a convenient appointment time. 10. OTHER INSTRUCTIONS:  __________________________________________________________________________________________________________________________________________________________________________________________  WHEN TO CALL YOUR DOCTOR: 1. Fever over 101.0 2. Inability to urinate 3. Nausea and/or vomiting 4. Extreme swelling or bruising 5. Continued bleeding from incision. 6. Increased pain, redness, or drainage from the incision  The clinic staff is available to answer your questions during regular business hours.  Please dont hesitate to call and ask to speak to one of the nurses for clinical concerns.  If you have a medical emergency, go to the nearest emergency room or call 911.  A surgeon from [facility] is always on call at the hospital   [address] [place], [place], [place]  [address] ?  [address], [place], [place]   [address] [phone] ? [phone] ? FAX [phone] Web site: [email or web address]

## 2017-04-12 ENCOUNTER — Encounter (HOSPITAL_BASED_OUTPATIENT_CLINIC_OR_DEPARTMENT_OTHER): Payer: Self-pay | Admitting: General Surgery

## 2017-04-23 ENCOUNTER — Telehealth: Payer: Self-pay | Admitting: Internal Medicine

## 2017-04-23 NOTE — Telephone Encounter (Signed)
Caller name:Jaramie Sannes Relationship to patient: Can be reached:[phone] Pharmacy:  Reason for call:would like to speak with nurse regarding injections that are due, would like to know for what and when he can come? Thursday?

## 2017-04-23 NOTE — Telephone Encounter (Signed)
Spoke w/ Pt, requesting pneumovax, TDAP, and Shingrix. 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 His son has NP appt w/ Dr. Charlett Blake on 04/25/2017 at [facility]. Will schedule nurse visit w/ me for 0800.

## 2017-04-23 NOTE — Telephone Encounter (Signed)
Is okay to proceed with immunization at his earliest convenience.

## 2017-04-24 ENCOUNTER — Encounter: Payer: Self-pay | Admitting: Family Medicine

## 2017-04-25 ENCOUNTER — Encounter: Payer: Self-pay | Admitting: Family Medicine

## 2017-04-25 ENCOUNTER — Ambulatory Visit (INDEPENDENT_AMBULATORY_CARE_PROVIDER_SITE_OTHER): Payer: 59 | Admitting: Family Medicine

## 2017-04-25 ENCOUNTER — Ambulatory Visit (INDEPENDENT_AMBULATORY_CARE_PROVIDER_SITE_OTHER): Payer: 59

## 2017-04-25 DIAGNOSIS — Z23 Encounter for immunization: Secondary | ICD-10-CM

## 2017-04-25 DIAGNOSIS — S83412A Sprain of medial collateral ligament of left knee, initial encounter: Secondary | ICD-10-CM | POA: Diagnosis not present

## 2017-04-25 NOTE — Progress Notes (Signed)
Pre visit review using our clinic review tool, if applicable. No additional management support is needed unless otherwise documented below in the visit note. 

## 2017-04-25 NOTE — Progress Notes (Signed)
Pt tolerated injections well. Pt informed to return in 2-6 months for Shingrix #2.

## 2017-04-25 NOTE — Assessment & Plan Note (Signed)
Doing much better at this time. If any worsening symptoms to come back. Otherwise start increasing activity slowly. Discussed the icing regimen, topical anti-inflammatories, as well as bracing.

## 2017-04-25 NOTE — Progress Notes (Signed)
[facility] [address] [place], [place] [address] Phone: [phone] Subjective:    I'm seeing this patient by the request  of:  Colon Branch, MD   CC: Knee pain Left side follow-up  CHE:[id number]  Cole James is a 56 y.o. male coming in with complaint of knee pain. Patient had significant swelling previously. Patient states that after the injection seems to be doing better. Was also found to have more of a meniscal injury with a partial MCL tear. Patient states that as long as he wears the brace he seems to do well. Nothing severe. Patient  possibly 85-90% better. Patient did recently have umbilical hernia surgery so we'll not be working out for some time.     Past Medical History:  Diagnosis Date  . Allergy    pet dander, seasonal  . Asthma    childhood  . GERD (gastroesophageal reflux disease)   . History of kidney stones    age 68  . 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 Tear of MCL (medial collateral ligament) of knee    fluid aspiration 04/04/17  . Umbilical hernia    Past Surgical History:  Procedure Laterality Date  . HERNIA REPAIR     inguinal, age 58  . INSERTION OF MESH N/A 04/11/2017   Procedure: INSERTION OF MESH;  Surgeon: [name], MD;  Location: [facility];  Service: General;  Laterality: N/A;  . UMBILICAL HERNIA REPAIR N/A 04/11/2017   Procedure: UMBILICAL HERNIA REPAIR;  Surgeon: [name], MD;  Location: [facility];  Service: General;  Laterality: N/A;   Social History   Social History  . Marital status: Married    Spouse name: N/A  . Number of children: 2  . Years of education: N/A   Occupational History  . phyisician    Social History Main Topics  . Smoking status: Never Smoker  . Smokeless tobacco: Never Used  . Alcohol use 2.4 oz/week    4 Shots of liquor per week     Comment: Social  . Drug use: No  . Sexual activity: Not Asked   Other Topics Concern  . None     Social History Narrative   Born in [place], live in [place] since childhood   Moved to [place] 2017   Son 28   Daughter 11, Doyle living w/ him   Soccer player    Allergies  Allergen Reactions  . Latex Dermatitis   Family History  Problem Relation Age of Onset  . Hypertension Father   . Prostate cancer Maternal Grandfather   . Cancer Paternal Grandfather        cholangio Ca  . Colon cancer Neg Hx   . Diabetes Neg Hx   . CAD Neg Hx     Past medical history, social, surgical and family history all reviewed in electronic medical record.  No pertanent information unless stated regarding to the chief complaint.   Review of Systems: No headache, visual changes, nausea, vomiting, diarrhea, constipation, dizziness, abdominal pain, skin rash, fevers, chills, night sweats, weight loss, swollen lymph nodes, body aches, joint swelling, muscle aches, chest pain, shortness of breath, mood changes.    Objective  Blood pressure 104/82, pulse 63, weight 186 lb (84.4 kg), SpO2 99 %.   Systems examined below as of 04/25/17 General: NAD A&O x3 mood, affect normal  HEENT: Pupils equal, extraocular movements intact no nystagmus  Respiratory: not short of breath at rest or with speaking Cardiovascular: No lower extremity edema, non tender Skin: Warm dry intact with no signs of infection or rash on extremities or on axial skeleton. Abdomen: Soft nontender, no masses Neuro: Cranial nerves  intact, neurovascularly intact in all extremities with 2+ DTRs and 2+ pulses. Lymph: No lymphadenopathy appreciated today  Gait normal with good balance and coordination.  MSK: Non tender with full range of motion and good stability and symmetric strength and tone of shoulders, elbows, wrist,  hips and ankles bilaterally.   Knee: Left No effusion noted Minimal tenderness over the medial joint space Full range of motion More pain with stressing the MCL Negative Mcmurray's, Apley's, and Thessalonian  tests. Mild painful patellar compression. Patellar glide mild crepitus. Patellar and quadriceps tendons unremarkable. Hamstring and quadriceps strength is normal.  Contralateral knee unremarkable     Impression and Recommendations:     This case required medical decision making of moderate complexity.      Note: This dictation was prepared with Dragon dictation along with smaller phrase technology. Any transcriptional errors that result from this process are unintentional.

## 2017-05-20 ENCOUNTER — Telehealth: Payer: Self-pay | Admitting: Internal Medicine

## 2017-05-22 NOTE — Telephone Encounter (Signed)
Done

## 2017-06-14 ENCOUNTER — Ambulatory Visit: Payer: 59 | Admitting: Internal Medicine

## 2017-07-03 ENCOUNTER — Encounter: Payer: Self-pay | Admitting: Internal Medicine

## 2017-07-08 ENCOUNTER — Encounter: Payer: Self-pay | Admitting: Internal Medicine

## 2017-07-15 ENCOUNTER — Encounter: Payer: Self-pay | Admitting: Internal Medicine

## 2017-07-17 ENCOUNTER — Ambulatory Visit (INDEPENDENT_AMBULATORY_CARE_PROVIDER_SITE_OTHER): Payer: 59

## 2017-07-17 DIAGNOSIS — Z23 Encounter for immunization: Secondary | ICD-10-CM | POA: Diagnosis not present

## 2017-07-17 NOTE — Progress Notes (Signed)
Pre visit review using our clinic review tool, if applicable. No additional management support is needed unless otherwise documented below in the visit note.  Pt informed that he was slightly feverish for 1 day after Shingrix #1 dose. 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 He would like to proceed w/ #2 dose.   Shingrix #2 administered, Pt tolerated injection well.

## 2017-08-16 ENCOUNTER — Telehealth: Payer: 59 | Admitting: Family

## 2017-08-16 DIAGNOSIS — J329 Chronic sinusitis, unspecified: Secondary | ICD-10-CM

## 2017-08-16 DIAGNOSIS — B9689 Other specified bacterial agents as the cause of diseases classified elsewhere: Secondary | ICD-10-CM | POA: Diagnosis not present

## 2017-08-16 MED ORDER — AMOXICILLIN-POT CLAVULANATE 875-125 MG PO TABS: 1.0000 | ORAL_TABLET | Freq: Two times a day (BID) | ORAL | 0 refills | Status: DC

## 2017-08-16 MED ORDER — PREDNISONE 5 MG PO TABS: 5.0000 mg | ORAL_TABLET | ORAL | 0 refills | Status: DC

## 2017-08-16 MED FILL — predniSONE 5 MG TABS: 5 | 6 days supply | Qty: 21 | Fill #0

## 2017-08-16 NOTE — Progress Notes (Signed)
Thank you for the details you included in the comment boxes. Those details are very helpful in determining the best course of treatment for you and help [place] to provide the best care. Azithromycin may work, but given the length of symptoms and the nature of your symptoms, Augmentin will actually have better tissue perfusion and help kill the bacteria a little better since it has been there for so long. Just wanted you to know I read your note about the z-pack. I'm also sending a steroid pack which is optional and can help with inflammation/swelling. Some people don't sleep well on prednisone so this is your call whether you take it, depending on whether you had a bad experience in the past. If you don't have a history of trouble with prednisone, you should be fine with the low-dose pack I'm sending.  We are sorry that you are not feeling well.  Here is how we plan to help!  Based on what you have shared with me it looks like you have sinusitis.  Sinusitis is inflammation and infection in the sinus cavities of the head.  Based on your presentation I believe you most likely have Acute Bacterial Sinusitis.  This is an infection caused by bacteria and is treated with antibiotics. I have prescribed Augmentin 875mg /125mg  one tablet twice daily with food, for 7 days. You may use an oral decongestant such as Mucinex D or if you have glaucoma or high blood pressure use plain Mucinex. Saline nasal spray help and can safely be used as often as needed for congestion.  If you develop worsening sinus pain, fever or notice severe headache and vision changes, or if symptoms are not better after completion of antibiotic, please schedule an appointment with a health care provider.    I have also sent a 5mg  Prednisone dose pack (steroid taper) given 3 weeks of symptoms  Sinus infections are not as easily transmitted as other respiratory infection, however we still recommend that you avoid close contact with loved ones,  especially the very young and elderly.  Remember to wash your hands thoroughly throughout the day as this is the number one way to prevent the spread of infection!  Home Care:  Only take medications as instructed by your medical team.  Complete the entire course of an antibiotic.  Do not take these medications with alcohol.  A steam or ultrasonic humidifier can help congestion.  You can place a towel over your head and breathe in the steam from hot water coming from a faucet.  Avoid close contacts especially the very young and the elderly.  Cover your mouth when you cough or sneeze.  Always remember to wash your hands.  Get Help Right Away If:  You develop worsening fever or sinus pain.  You develop a severe head ache or visual changes.  Your symptoms persist after you have completed your treatment plan.  Make sure you  Understand these instructions.  Will watch your condition.  Will get help right away if you are not doing well or get worse.  Your e-visit answers were reviewed by a board certified advanced clinical practitioner to complete your personal care plan.  Depending on the condition, your plan could have included both over the counter or prescription medications.  If there is a problem please reply  once you have received a response from your provider.  Your safety is important to [place].  If you have drug allergies check your prescription carefully.    You can  use MyChart to ask questions about today's visit, request a non-urgent call back, or ask for a work or school excuse for 24 hours related to this e-Visit. If it has been greater than 24 hours you will need to follow up with your provider, or enter a new e-Visit to address those concerns.  You will get an e-mail in the next two days asking about your experience.  I hope that your e-visit has been valuable and will speed your recovery. Thank you for using e-visits.

## 2017-08-19 ENCOUNTER — Other Ambulatory Visit: Payer: Self-pay | Admitting: Internal Medicine

## 2017-08-19 MED ORDER — AZITHROMYCIN 250 MG PO TABS: ORAL_TABLET | ORAL | 0 refills | Status: DC

## 2017-08-19 MED FILL — AZITHROMYCIN 250 MG TABLET: 250 | 5 days supply | Qty: 6 | Fill #0

## 2017-08-19 NOTE — Progress Notes (Signed)
Called for 12 days on sinus pain, pressure and drainage Failed OTC

## 2017-08-23 ENCOUNTER — Encounter: Payer: Self-pay | Admitting: Internal Medicine

## 2017-08-23 ENCOUNTER — Ambulatory Visit (INDEPENDENT_AMBULATORY_CARE_PROVIDER_SITE_OTHER): Payer: 59 | Admitting: Internal Medicine

## 2017-08-23 VITALS — BP 122/78 | HR 74 | Temp 98.0°F | Resp 14 | Ht 68.0 in | Wt 192.5 lb

## 2017-08-23 DIAGNOSIS — J029 Acute pharyngitis, unspecified: Secondary | ICD-10-CM | POA: Diagnosis not present

## 2017-08-23 DIAGNOSIS — J069 Acute upper respiratory infection, unspecified: Secondary | ICD-10-CM

## 2017-08-23 LAB — POCT RAPID STREP A (OFFICE): RAPID STREP A SCREEN: NEGATIVE

## 2017-08-23 MED ORDER — HYDROCODONE-HOMATROPINE 5-1.5 MG/5ML PO SYRP: 5.0000 mL | ORAL_SOLUTION | Freq: Every evening | ORAL | 0 refills | Status: DC | PRN

## 2017-08-23 MED ORDER — AZELASTINE HCL 0.1 % NA SOLN: 2.0000 | Freq: Two times a day (BID) | NASAL | 6 refills | Status: DC

## 2017-08-23 NOTE — Progress Notes (Signed)
Pre visit review using our clinic review tool, if applicable. No additional management support is needed unless otherwise documented below in the visit note. 

## 2017-08-23 NOTE — Progress Notes (Signed)
Subjective:    Patient ID: Cole James, male    DOB: [birth year], 56 y.o.   MRN: [id number]  DOS:  08/23/2017 Type of visit - description : acute Interval history: Sxs started 3 weeks ago, initially he thought he had allergies. He felt sinus pressure, congestion, felt feverish. 08/16/2017, had a "E" visit, was rx amoxicillin and prednisone, he did not take those medication rather got a Z-Pak. He improved temporarily but symptoms are actually coming back: At this point his main concern is sore throat, unable to sleep due to the sore throat. he does have some cough, dry, History of asthma but he is not wheezing.  Review of Systems No fever throughout all his illness Nasal discharge is minimal, some postnasal dripping. No nausea or vomiting.  Had diarrhea one time after he finished Zithromax. Denies any itchy eyes or nose.  Mild sneezing noted. Fever has not been documented  Past Medical History:  Diagnosis Date  . Allergy    pet dander, seasonal, apple, peach  . Asthma    childhood  . GERD (gastroesophageal reflux disease)   . History of kidney stones    age 55  . 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 Tear of MCL (medial collateral ligament) of knee    fluid aspiration 04/04/17  . Umbilical hernia     Past Surgical History:  Procedure Laterality Date  . HERNIA REPAIR     inguinal, age 52    Social History   Socioeconomic History  . Marital status: Married    Spouse name: Not on file  . Number of children: 2  . Years of education: Not on file  . Highest education level: Not on file  Social Needs  . Financial resource strain: Not on file  . Food insecurity - worry: Not on file  . Food insecurity - inability: Not on file  . Transportation needs - medical: Not on file  . Transportation needs - non-medical: Not on file  Occupational History  . Occupation: phyisician  Tobacco Use  . Smoking status: Never Smoker  . Smokeless tobacco: Never Used  Substance and Sexual  Activity  . Alcohol use: Yes    Alcohol/week: 2.4 oz    Types: 4 Shots of liquor per week    Comment: Social  . Drug use: No  . Sexual activity: Not on file  Other Topics Concern  . Not on file  Social History Narrative   Born in [place], live in [place] since childhood   Moved to [place] 2017   Son 28   Daughter 40, [place] living w/ him   Soccer player       Allergies as of 08/23/2017      Reactions   Apple Other (See Comments)   Unspecified; noted in records received on 07/08/2017   Latex Dermatitis   Peach [prunus Persica] Other (See Comments)   Unspecified, noted in records received on 07/08/2017      Medication List        Accurate as of 08/23/17 11:59 PM. Always use your most recent med list.          albuterol 108 (90 Base) MCG/ACT inhaler Commonly known as:  PROVENTIL HFA;VENTOLIN HFA Inhale 1-2 puffs into the lungs every 6 (six) hours as needed for wheezing or shortness of breath.   azelastine 0.1 % nasal spray Commonly known as:  ASTELIN Place 2 sprays into both nostrils 2 (two) times daily.  fexofenadine 30 MG tablet Commonly known as:  ALLEGRA Take 30 mg by mouth as needed.   HYDROcodone-homatropine 5-1.5 MG/5ML syrup Commonly known as:  HYCODAN Take 5 mLs by mouth at bedtime as needed for cough.   montelukast 10 MG tablet Commonly known as:  SINGULAIR Take 10 mg by mouth at bedtime as needed.          Objective:   Physical Exam BP 122/78 (BP Location: Left Arm, Patient Position: Sitting, Cuff Size: Small)   Pulse 74   Temp 98 F (36.7 C) (Oral)   Resp 14   Ht 5\' 8"  (1.727 m)   Wt 192 lb 8 oz (87.3 kg)   SpO2 97%   BMI 29.27 kg/m  General:   Well developed, well nourished . NAD.  HEENT:  Normocephalic . Face symmetric, atraumatic Nose congested. Throat symmetric, red, no white patches. Neck: No LEDs, mass or asymmetry. Lungs:  CTA B Normal respiratory effort, no intercostal retractions, no accessory muscle use. Heart: RRR,   no murmur.  No pretibial edema bilaterally  Skin: Not pale. Not jaundice Neurologic:  alert & oriented X3.  Speech normal, gait appropriate for age and unassisted Psych--  Cognition and judgment appear intact.  Cooperative with normal attention span and concentration.  Behavior appropriate. No anxious or depressed appearing.      Assessment & Plan:   Assessment GERD Asthma-mild , worse as a child, cold induced  Allergies  H/o +PPD H/o urolithiasis , age 55 2010--->  CP, elevated BP-- r/o MI, cardiac cath (-)  PLAN: URI: Suspect viral URI with sore throat as the predominant symptom. Rapid strep test negative, we are sending throat culture. Asthma is currently not exacerbated. Plan: Conservative treatment with Astelin, Flonase, hydrocodone at nighttime, Mucinex.   Call if not better, if ST ongoing, a small dose of prednisone may be of help. Definitely call if symptoms severe.

## 2017-08-23 NOTE — Patient Instructions (Signed)
Rest, fluids , tylenol  For cough:  Take Mucinex DM twice a day as needed until better If the cough is severe, take hydrocodone at nighttime.  Will cause drowsiness  For nasal congestion: Use OTC   Flonase : 2 nasal sprays on each side of the nose in the morning until you feel better Use ASTELIN a prescribed spray : 2 nasal sprays on each side of the nose at night until you feel better   Avoid decongestants such as  Pseudoephedrine or phenylephrine     Call if not gradually better over the next  10 days  Call anytime if the symptoms are severe

## 2017-08-25 LAB — CULTURE, GROUP A STREP
MICRO NUMBER: 81232933
SPECIMEN QUALITY:: ADEQUATE

## 2017-08-25 NOTE — Assessment & Plan Note (Signed)
URI: Suspect viral URI with sore throat as the predominant symptom. Rapid strep test negative, we are sending throat culture. Asthma is currently not exacerbated. Plan: Conservative treatment with Astelin, Flonase, hydrocodone at nighttime, Mucinex.   Call if not better, if ST ongoing, a small dose of prednisone may be of help. Definitely call if symptoms severe.

## 2017-11-18 ENCOUNTER — Encounter: Payer: Self-pay | Admitting: Internal Medicine

## 2018-06-17 ENCOUNTER — Encounter: Payer: Self-pay | Admitting: Internal Medicine

## 2018-06-17 NOTE — Telephone Encounter (Signed)
Thursday afternoon is the less busy.  Okay to accommodate him that day.

## 2018-06-17 NOTE — Telephone Encounter (Signed)
Dr Larose Kells -- pt is requesting a physical Wednesday or Thursday this week per message from the [facility]. Your session limits have been reached on both days. Please advise if it is ok to make an exception for this pt? If so, do you have a preference as to which day/slot to give him?

## 2018-06-19 ENCOUNTER — Encounter: Payer: 59 | Admitting: Internal Medicine

## 2018-06-24 ENCOUNTER — Encounter: Payer: Self-pay | Admitting: Internal Medicine

## 2018-06-24 ENCOUNTER — Ambulatory Visit (INDEPENDENT_AMBULATORY_CARE_PROVIDER_SITE_OTHER): Payer: 59 | Admitting: Internal Medicine

## 2018-06-24 VITALS — BP 120/70 | HR 63 | Temp 98.0°F | Ht 68.0 in | Wt 195.4 lb

## 2018-06-24 DIAGNOSIS — Z23 Encounter for immunization: Secondary | ICD-10-CM | POA: Diagnosis not present

## 2018-06-24 DIAGNOSIS — Z1211 Encounter for screening for malignant neoplasm of colon: Secondary | ICD-10-CM

## 2018-06-24 DIAGNOSIS — Z0000 Encounter for general adult medical examination without abnormal findings: Secondary | ICD-10-CM

## 2018-06-24 NOTE — Patient Instructions (Addendum)
  GO TO THE FRONT DESK Go to [facility] this week  Schedule your next appointment for a physical exam in 1 year    HPV #2 in 2 months  HPV #3 in 6 months

## 2018-06-24 NOTE — Assessment & Plan Note (Addendum)
-  Td 2018, pnm 23: 2018; shingrix x 2 : 2018 Also, Dr. Horris Latino went recently to a conference where experts recommended HPV immunizations for people over 46.  I see no contraindication, HPV #1 provided today. - CCS: Cscope 2010 wnl per pt.  Refer to GI, Dr. Henrene Pastor - Prostate ca screening-  DRE and PSA  normal last year, no symptoms ; reassess next year. Labs: Will come back fasting for CMP, FLP, CBC, TSH Has gained some weight, had to stop playing soccer temporarily due to her knee pain but plans to go back.  Encouraged healthy diet.

## 2018-06-24 NOTE — Progress Notes (Signed)
Subjective:    Patient ID: Cole James, male    DOB: January 13, 1961, 57 y.o.   MRN: [id number]  DOS:  06/24/2018 Type of visit - description : CPX Interval history: Here for CPX  Review of Systems Admits to a lot of stress lately, getting divorced. Denies clinical anxiety or depression. Has skin growth at the right buttock, would like it checked. Asthma well controlled, hardly ever uses albuterol Occasional heartburn that decreased with Zantac. Specifically no chest pain, difficulty breathing.  Other than above, a 14 point review of systems is negative   Past Medical History:  Diagnosis Date  . Allergy    pet dander, seasonal, apple, peach  . Asthma    childhood  . GERD (gastroesophageal reflux disease)   . History of kidney stones    age 48  . 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 Tear of MCL (medial collateral ligament) of knee    fluid aspiration 04/04/17  . Umbilical hernia     Past Surgical History:  Procedure Laterality Date  . HERNIA REPAIR     inguinal, age 23  . INSERTION OF MESH N/A 04/11/2017   Procedure: INSERTION OF MESH;  Surgeon: [name], MD;  Location: [facility];  Service: General;  Laterality: N/A;  . UMBILICAL HERNIA REPAIR N/A 04/11/2017   Procedure: UMBILICAL HERNIA REPAIR;  Surgeon: [name], MD;  Location: [facility];  Service: General;  Laterality: N/A;    Social History   Socioeconomic History  . Marital status: Married    Spouse name: Not on file  . Number of children: 2  . Years of education: Not on file  . Highest education level: Not on file  Occupational History  . Occupation: phyisician  Social Needs  . Financial resource strain: Not on file  . Food insecurity:    Worry: Not on file    Inability: Not on file  . Transportation needs:    Medical: Not on file    Non-medical: Not on file  Tobacco Use  . Smoking status: Never Smoker  . Smokeless tobacco: Never Used  Substance and Sexual  Activity  . Alcohol use: Yes    Alcohol/week: 4.0 standard drinks    Types: 4 Shots of liquor per week    Comment: Social  . Drug use: No  . Sexual activity: Not on file  Lifestyle  . Physical activity:    Days per week: Not on file    Minutes per session: Not on file  . Stress: Not on file  Relationships  . Social connections:    Talks on phone: Not on file    Gets together: Not on file    Attends religious service: Not on file    Active member of club or organization: Not on file    Attends meetings of clubs or organizations: Not on file    Relationship status: Not on file  . Intimate partner violence:    Fear of current or ex partner: Not on file    Emotionally abused: Not on file    Physically abused: Not on file    Forced sexual activity: Not on file  Other Topics Concern  . Not on file  Social History Narrative   Born in [place], live in [place] since childhood   Moved to [place] 2017   Son 28   Daughter 89, [place] living w/ him   Theme park manager  Family History  Problem Relation Age of Onset  . Hypertension Father   . Prostate cancer Maternal Grandfather   . Cancer Paternal Grandfather        cholangio Ca  . Colon cancer Neg Hx   . Diabetes Neg Hx   . CAD Neg Hx      Allergies as of 06/24/2018      Reactions   Apple Other (See Comments)   Unspecified; noted in records received on 07/08/2017   Latex Dermatitis   Peach [prunus Persica] Other (See Comments)   Unspecified, noted in records received on 07/08/2017      Medication List        Accurate as of 06/24/18 11:59 PM. Always use your most recent med list.          albuterol 108 (90 Base) MCG/ACT inhaler Commonly known as:  PROVENTIL HFA;VENTOLIN HFA Inhale 1-2 puffs into the lungs every 6 (six) hours as needed for wheezing or shortness of breath.   fexofenadine 30 MG tablet Commonly known as:  ALLEGRA Take 30 mg by mouth as needed.          Objective:   Physical Exam  Skin:        BP 120/70 (BP Location: Right Arm, Patient Position: Sitting, Cuff Size: Large)   Pulse 63   Temp 98 F (36.7 C) (Oral)   Ht 5\' 8"  (1.727 m)   Wt 195 lb 6.4 oz (88.6 kg)   SpO2 96%   BMI 29.71 kg/m  General: Well developed, NAD, see BMI.  Neck: No  thyromegaly  HEENT:  Normocephalic . Face symmetric, atraumatic Lungs:  CTA B Normal respiratory effort, no intercostal retractions, no accessory muscle use. Heart: RRR,  no murmur.  No pretibial edema bilaterally  Abdomen:  Not distended, soft, non-tender. No rebound or rigidity.  Diathesis recti noted Skin: Exposed areas without rash. Not pale. Not jaundice Neurologic:  alert & oriented X3.  Speech normal, gait appropriate for age and unassisted Strength symmetric and appropriate for age.  Psych: Cognition and judgment appear intact.  Cooperative with normal attention span and concentration.  Behavior appropriate. No anxious or depressed appearing.     Assessment & Plan:   Assessment GERD Asthma-mild , worse as a child, cold induced  Allergies  H/o +PPD H/o urolithiasis , age 41 2010--->  CP, elevated BP-- r/o MI, cardiac cath (-)  PLAN: GERD: Controlled on Zantac and Tums as needed Asthma: Hardly ever uses albuterol Stress: Getting a divorce soon, obviously very stressed, he is doing well, keep himself busy.  Advised patient to call anytime if he feels needs help such as counseling or medication.  A list of counselors provided Skin tag: Patient is concerned because he cannot see the lesion but it is a skin tag.  Will recheck yearly. RTC 1 year

## 2018-06-25 NOTE — Assessment & Plan Note (Signed)
GERD: Controlled on Zantac and Tums as needed Asthma: Hardly ever uses albuterol Stress: Getting a divorce soon, obviously very stressed, he is doing well, keep himself busy.  Advised patient to call anytime if he feels needs help such as counseling or medication.  A list of counselors provided Skin tag: Patient is concerned because he cannot see the lesion but it is a skin tag.  Will recheck yearly. RTC 1 year

## 2018-10-28 ENCOUNTER — Encounter: Payer: Self-pay | Admitting: Internal Medicine

## 2018-10-31 ENCOUNTER — Ambulatory Visit (INDEPENDENT_AMBULATORY_CARE_PROVIDER_SITE_OTHER): Payer: 59

## 2018-10-31 DIAGNOSIS — Z23 Encounter for immunization: Secondary | ICD-10-CM | POA: Diagnosis not present

## 2019-03-18 MED FILL — FINASTERIDE 5 MG TABLET: 5 | 30 days supply | Qty: 30 | Fill #0

## 2019-03-18 MED FILL — CIPROFLOXACIN HCL 500 MG TA: 500 | 10 days supply | Qty: 20 | Fill #0

## 2019-05-26 ENCOUNTER — Encounter: Payer: Self-pay | Admitting: Internal Medicine

## 2019-09-15 ENCOUNTER — Other Ambulatory Visit: Payer: Self-pay

## 2019-09-15 DIAGNOSIS — Z20822 Contact with and (suspected) exposure to covid-19: Secondary | ICD-10-CM

## 2019-09-16 LAB — NOVEL CORONAVIRUS, NAA: SARS-CoV-2, NAA: NOT DETECTED

## 2019-11-17 MED FILL — FINASTERIDE 1 MG TABLET: 1 | 90 days supply | Qty: 90 | Fill #0

## 2019-12-29 ENCOUNTER — Ambulatory Visit (HOSPITAL_COMMUNITY)
Admission: EM | Admit: 2019-12-29 | Discharge: 2019-12-29 | Disposition: A | Discharge status: Home / Self Care | Payer: 59 | Attending: Internal Medicine | Admitting: Internal Medicine

## 2019-12-29 ENCOUNTER — Other Ambulatory Visit: Payer: Self-pay

## 2019-12-29 ENCOUNTER — Encounter (HOSPITAL_COMMUNITY): Payer: Self-pay

## 2019-12-29 DIAGNOSIS — Z1152 Encounter for screening for COVID-19: Secondary | ICD-10-CM | POA: Diagnosis not present

## 2019-12-29 DIAGNOSIS — Z20822 Contact with and (suspected) exposure to covid-19: Secondary | ICD-10-CM | POA: Diagnosis not present

## 2019-12-29 NOTE — ED Provider Notes (Signed)
[facility]    CSN: [id number] Arrival date & time: 12/29/19  1107      History   Chief Complaint Chief Complaint  Patient presents with  . covid test for travel    HPI Cole James is a 59 y.o. male comes to urgent care for covid testing for travel. No symptoms. No exposures. Covid vaccination completed.  HPI  Past Medical History:  Diagnosis Date  . Allergy    pet dander, seasonal, apple, peach  . Asthma    childhood  . GERD (gastroesophageal reflux disease)   . History of kidney stones    age 29  . 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 Tear of MCL (medial collateral ligament) of knee    fluid aspiration 04/04/17  . Umbilical hernia     Patient Active Problem List   Diagnosis Date Noted  . Tear of MCL (medial collateral ligament) of knee, left, initial encounter 04/04/2017  . GERD (gastroesophageal reflux disease) 12/07/2016  . PCP NOTES >>>>>>>>>>>>>>>>>>> 12/07/2016  . Annual physical exam 12/06/2016    Past Surgical History:  Procedure Laterality Date  . HERNIA REPAIR     inguinal, age 45  . INSERTION OF MESH N/A 04/11/2017   Procedure: INSERTION OF MESH;  Surgeon: [name], MD;  Location: [facility];  Service: General;  Laterality: N/A;  . UMBILICAL HERNIA REPAIR N/A 04/11/2017   Procedure: UMBILICAL HERNIA REPAIR;  Surgeon: [name], MD;  Location: [facility];  Service: General;  Laterality: N/A;       Home Medications    Prior to Admission medications   Medication Sig Start Date End Date Taking? Authorizing Provider  albuterol (PROVENTIL HFA;VENTOLIN HFA) 108 (90 Base) MCG/ACT inhaler Inhale 1-2 puffs into the lungs every 6 (six) hours as needed for wheezing or shortness of breath. Patient not taking: Reported on 06/24/2018 12/06/16   Colon Branch, MD  fexofenadine (ALLEGRA) 30 MG tablet Take 30 mg by mouth as needed.    [provider]    Family History Family History  Problem  Relation Age of Onset  . Hypertension Father   . Prostate cancer Maternal Grandfather   . Cancer Paternal Grandfather        cholangio Ca  . Colon cancer Neg Hx   . Diabetes Neg Hx   . CAD Neg Hx     Social History Social History   Tobacco Use  . Smoking status: Never Smoker  . Smokeless tobacco: Never Used  Substance Use Topics  . Alcohol use: Yes    Alcohol/week: 4.0 standard drinks    Types: 4 Shots of liquor per week    Comment: Social  . Drug use: No     Allergies   Apple, Latex, and Peach [prunus persica]   Review of Systems Review of Systems  Constitutional: Negative.   Respiratory: Negative.      Physical Exam Triage Vital Signs ED Triage Vitals  Enc Vitals Group     BP 12/29/19 1112 (!) 138/94     Pulse Rate 12/29/19 1112 69     Resp 12/29/19 1112 18     Temp 12/29/19 1112 97.7 F (36.5 C)     Temp Source 12/29/19 1112 Oral     SpO2 12/29/19 1112 99 %     Weight --      Height --      Head Circumference --      Peak Flow --  Pain Score 12/29/19 1113 0     Pain Loc --      Pain Edu? --      Excl. in [place]? --    No data found.  Updated Vital Signs BP (!) 138/94 (BP Location: Right Arm)   Pulse 69   Temp 97.7 F (36.5 C) (Oral)   Resp 18   SpO2 99%   Visual Acuity Right Eye Distance:   Left Eye Distance:   Bilateral Distance:    Right Eye Near:   Left Eye Near:    Bilateral Near:     Physical Exam Vitals and nursing note reviewed.  Neurological:     General: No focal deficit present.     Mental Status: He is oriented to person, place, and time.      UC Treatments / Results  Labs (all labs ordered are listed, but only abnormal results are displayed) Labs Reviewed  NOVEL CORONAVIRUS, NAA (HOSP ORDER, SEND-OUT TO REF LAB; TAT 18-24 HRS)    EKG   Radiology No results found.  Procedures Procedures (including critical care time)  Medications Ordered in UC Medications - No data to display  Initial Impression /  Assessment and Plan / UC Course  I have reviewed the triage vital signs and the nursing notes.  Pertinent labs & imaging results that were available during my care of the patient were reviewed by me and considered in my medical decision making (see chart for details).     1. Covid testing for travel: Covid-19 PCR testing done. Patient has mychart. Final Clinical Impressions(s) / UC Diagnoses   Final diagnoses:  None   Discharge Instructions   None    ED Prescriptions    None     PDMP not reviewed this encounter.   Chase Picket, MD 12/29/19 1128

## 2019-12-29 NOTE — ED Notes (Signed)
Bed: UC09 Expected date: 12/29/19 Expected time: 10:58 AM Means of arrival:  Comments: Dr Lanny Cramp

## 2019-12-29 NOTE — ED Triage Notes (Signed)
Request COVID test for international travel. Dr. Lanny Cramp in to perform swab/test.

## 2019-12-31 LAB — NOVEL CORONAVIRUS, NAA (HOSP ORDER, SEND-OUT TO REF LAB; TAT 18-24 HRS): SARS-CoV-2, NAA: NOT DETECTED

## 2020-01-27 ENCOUNTER — Encounter: Payer: Self-pay | Admitting: Internal Medicine

## 2020-01-27 ENCOUNTER — Ambulatory Visit: Payer: 59 | Attending: Internal Medicine

## 2020-01-27 DIAGNOSIS — Z20822 Contact with and (suspected) exposure to covid-19: Secondary | ICD-10-CM

## 2020-01-27 DIAGNOSIS — Z1211 Encounter for screening for malignant neoplasm of colon: Secondary | ICD-10-CM

## 2020-01-28 LAB — NOVEL CORONAVIRUS, NAA: SARS-CoV-2, NAA: NOT DETECTED

## 2020-01-28 LAB — SARS-COV-2, NAA 2 DAY TAT

## 2020-02-04 ENCOUNTER — Other Ambulatory Visit: Payer: Self-pay

## 2020-02-04 ENCOUNTER — Ambulatory Visit (AMBULATORY_SURGERY_CENTER): Payer: Self-pay | Admitting: *Deleted

## 2020-02-04 VITALS — Temp 97.3°F | Ht 68.0 in | Wt 190.0 lb

## 2020-02-04 DIAGNOSIS — Z1211 Encounter for screening for malignant neoplasm of colon: Secondary | ICD-10-CM

## 2020-02-04 MED ORDER — SUPREP BOWEL PREP KIT 17.5-3.13-1.6 GM/177ML PO SOLN: 1.0000 | Freq: Once | ORAL | 0 refills | Status: AC

## 2020-02-04 MED FILL — SUPREP BOWEL PREP KIT: 17.5-3.13-1 | 2 days supply | Qty: 354 | Fill #0

## 2020-02-04 NOTE — Progress Notes (Signed)
Completed Covid vacc series 11-17-2019  No egg or soy allergy known to patient  No issues with past sedation with any surgeries  or procedures, no intubation problems  No diet pills per patient No home 02 use per patient  No blood thinners per patient  Pt denies issues with constipation  No A fib or A flutter  EMMI video sent to pt's e mail   Due to the COVID-19 pandemic we are asking patients to follow these guidelines. Please only bring one care partner. Please be aware that your care partner may wait in the car in the parking lot or if they feel like they will be too hot to wait in the car, they may wait in the lobby on the 4th floor. All care partners are required to wear a mask the entire time (we do not have any that we can provide them), they need to practice social distancing, and we will do a Covid check for all patient's and care partners when you arrive. Also we will check their temperature and your temperature. If the care partner waits in their car they need to stay in the parking lot the entire time and we will call them on their cell phone when the patient is ready for discharge so they can bring the car to the front of the building. Also all patient's will need to wear a mask into building.

## 2020-02-08 ENCOUNTER — Encounter: Payer: Self-pay | Admitting: Gastroenterology

## 2020-02-09 ENCOUNTER — Other Ambulatory Visit: Payer: Self-pay

## 2020-02-09 ENCOUNTER — Encounter: Payer: Self-pay | Admitting: Gastroenterology

## 2020-02-09 ENCOUNTER — Ambulatory Visit (AMBULATORY_SURGERY_CENTER): Payer: 59 | Admitting: Gastroenterology

## 2020-02-09 VITALS — BP 134/65 | HR 71 | Temp 96.6°F | Resp 14 | Ht 68.0 in | Wt 190.0 lb

## 2020-02-09 DIAGNOSIS — K635 Polyp of colon: Secondary | ICD-10-CM

## 2020-02-09 DIAGNOSIS — Z1211 Encounter for screening for malignant neoplasm of colon: Secondary | ICD-10-CM | POA: Diagnosis not present

## 2020-02-09 DIAGNOSIS — D123 Benign neoplasm of transverse colon: Secondary | ICD-10-CM

## 2020-02-09 DIAGNOSIS — Z86010 Personal history of colonic polyps: Secondary | ICD-10-CM | POA: Diagnosis not present

## 2020-02-09 MED ORDER — SODIUM CHLORIDE 0.9 % IV SOLN: 500.0000 mL | Freq: Once | INTRAVENOUS | Status: DC

## 2020-02-09 NOTE — Op Note (Signed)
[facility] Patient Name: Cole James Procedure Date: 02/09/2020 1:17 PM MRN: [id number] Endoscopist: Ladene Artist , MD Age: 59 Referring MD:  Date of Birth: 04/21/1961 Gender: Male Account #: [id number] Procedure:                Colonoscopy Indications:              Screening for colorectal malignant neoplasm Medicines:                Monitored Anesthesia Care Procedure:                Pre-Anesthesia Assessment:                           - Prior to the procedure, a History and Physical                            was performed, and patient medications and                            allergies were reviewed. The patient's tolerance of                            previous anesthesia was also reviewed. The risks                            and benefits of the procedure and the sedation                            options and risks were discussed with the patient.                            All questions were answered, and informed consent                            was obtained. Prior Anticoagulants: The patient has                            taken no previous anticoagulant or antiplatelet                            agents. ASA Grade Assessment: II - A patient with                            mild systemic disease. After reviewing the risks                            and benefits, the patient was deemed in                            satisfactory condition to undergo the procedure.                           After obtaining informed consent, the colonoscope  was passed under direct vision. Throughout the                            procedure, the patient's blood pressure, pulse, and                            oxygen saturations were monitored continuously. The                            Colonoscope was introduced through the anus and                            advanced to the the cecum, identified by                            appendiceal orifice and  ileocecal valve. The                            ileocecal valve, appendiceal orifice, and rectum                            were photographed. The quality of the bowel                            preparation was excellent. The colonoscopy was                            performed without difficulty. The patient tolerated                            the procedure well. Scope In: 1:32:44 PM Scope Out: 1:50:59 PM Scope Withdrawal Time: 0 hours 16 minutes 19 seconds  Total Procedure Duration: 0 hours 18 minutes 15 seconds  Findings:                 The perianal and digital rectal examinations were                            normal.                           Two sessile polyps were found in the transverse                            colon. The polyps were 6 to 7 mm in size. These                            polyps were removed with a cold snare. Resection                            and retrieval were complete.                           A single medium-mouthed diverticulum was found in  the ascending colon. There was no evidence of                            diverticular bleeding.                           Multiple small-mouthed diverticula were found in                            the left colon. There was no evidence of                            diverticular bleeding.                           Internal hemorrhoids were found during                            retroflexion. The hemorrhoids were moderate and                            Grade I (internal hemorrhoids that do not prolapse).                           The exam was otherwise without abnormality on                            direct and retroflexion views. Complications:            No immediate complications. Estimated blood loss:                            None. Estimated Blood Loss:     Estimated blood loss: none. Impression:               - Two 6 to 7 mm polyps in the transverse colon,                             removed with a cold snare. Resected and retrieved.                           - Mild diverticulosis in the left colon.                           - Mild diverticulosis in the ascending colon.                           - Internal hemorrhoids.                           - The examination was otherwise normal on direct                            and retroflexion views. Recommendation:           - Repeat colonoscopy after studies are complete for  surveillance based on pathology results.                           - Patient has a contact number available for                            emergencies. The signs and symptoms of potential                            delayed complications were discussed with the                            patient. Return to normal activities tomorrow.                            Written discharge instructions were provided to the                            patient.                           - Continue present medications.                           - Await pathology results.                           - High fiber diet indefinitely. Ladene Artist, MD 02/09/2020 1:55:23 PM This report has been signed electronically.

## 2020-02-09 NOTE — Progress Notes (Signed)
PT taken to PACU. Monitors in place. VSS. Report given to RN. 

## 2020-02-09 NOTE — Progress Notes (Signed)
Called to room to assist during endoscopic procedure.  Patient ID and intended procedure confirmed with present staff. Received instructions for my participation in the procedure from the performing physician.  

## 2020-02-09 NOTE — Patient Instructions (Signed)
YOU HAD AN ENDOSCOPIC PROCEDURE TODAY AT [facility]:   Refer to the procedure report that was given to you for any specific questions about what was found during the examination.  If the procedure report does not answer your questions, please call your gastroenterologist to clarify.  If you requested that your care partner not be given the details of your procedure findings, then the procedure report has been included in a sealed envelope for you to review at your convenience later.  YOU SHOULD EXPECT: Some feelings of bloating in the abdomen. Passage of more gas than usual.  Walking can help get rid of the air that was put into your GI tract during the procedure and reduce the bloating. If you had a lower endoscopy (such as a colonoscopy or flexible sigmoidoscopy) you may notice spotting of blood in your stool or on the toilet paper. If you underwent a bowel prep for your procedure, you may not have a normal bowel movement for a few days.  Please Note:  You might notice some irritation and congestion in your nose or some drainage.  This is from the oxygen used during your procedure.  There is no need for concern and it should clear up in a day or so.  SYMPTOMS TO REPORT IMMEDIATELY:   Following lower endoscopy (colonoscopy or flexible sigmoidoscopy):  Excessive amounts of blood in the stool  Significant tenderness or worsening of abdominal pains  Swelling of the abdomen that is new, acute  Fever of 100F or higher   For urgent or emergent issues, a gastroenterologist can be reached at any hour by calling [phone]. Do not use MyChart messaging for urgent concerns.    DIET:  We do recommend a small meal at first, but then you may proceed to your regular diet.  Drink plenty of fluids but you should avoid alcoholic beverages for 24 hours. Follow a High Fiber Diet (see handout given to you by your recovery nurse).  MEDICATIONS: Continue present medications.  Please see  handouts given to you by your recovery nurse.  ACTIVITY:  You should plan to take it easy for the rest of today and you should NOT DRIVE or use heavy machinery until tomorrow (because of the sedation medicines used during the test).    FOLLOW UP: Our staff will call the number listed on your records 48-72 hours following your procedure to check on you and address any questions or concerns that you may have regarding the information given to you following your procedure. If we do not reach you, we will leave a message.  We will attempt to reach you two times.  During this call, we will ask if you have developed any symptoms of COVID 19. If you develop any symptoms (ie: fever, flu-like symptoms, shortness of breath, cough etc.) before then, please call [phone].  If you test positive for Covid 19 in the 2 weeks post procedure, please call and report this information to [place].    If any biopsies were taken you will be contacted by phone or by letter within the next 1-3 weeks.  Please call us at [phone] if you have not heard about the biopsies in 3 weeks.   Thank you for allowing [place] to provide for your healthcare needs today.   SIGNATURES/CONFIDENTIALITY: You and/or your care partner have signed paperwork which will be entered into your electronic medical record.  These signatures attest to the fact that that the information above on your  After Visit Summary has been reviewed and is understood.  Full responsibility of the confidentiality of this discharge information lies with you and/or your care-partner.

## 2020-02-09 NOTE — Progress Notes (Signed)
Temp by Ledora Bottcher by   Pt's states no medical or surgical changes since previsit or office visit.

## 2020-02-11 ENCOUNTER — Telehealth: Payer: Self-pay

## 2020-02-11 NOTE — Telephone Encounter (Signed)
  Follow up Call-  Call back number 02/09/2020  Post procedure Call Back phone  # [phone]  Permission to leave phone message Yes  Some recent data might be hidden     Patient questions:  Do you have a fever, pain , or abdominal swelling? No. Pain Score  0 *  Have you tolerated food without any problems? Yes.    Have you been able to return to your normal activities? Yes.    Do you have any questions about your discharge instructions: Diet   No. Medications  No. Follow up visit  No.  Do you have questions or concerns about your Care? No.  Actions: * If pain score is 4 or above: No action needed, pain <4.  1. Have you developed a fever since your procedure? no  2.   Have you had an respiratory symptoms (SOB or cough) since your procedure? no  3.   Have you tested positive for COVID 19 since your procedure no  4.   Have you had any family members/close contacts diagnosed with the COVID 19 since your procedure?  no   If yes to any of these questions please route to Joylene John, RN and Erenest Rasher, RN

## 2020-02-11 NOTE — Telephone Encounter (Signed)
LVM

## 2020-02-16 ENCOUNTER — Encounter: Payer: Self-pay | Admitting: Gastroenterology

## 2020-03-04 ENCOUNTER — Other Ambulatory Visit: Payer: Self-pay

## 2020-03-04 ENCOUNTER — Ambulatory Visit: Payer: 59 | Admitting: Orthopaedic Surgery

## 2020-03-04 ENCOUNTER — Ambulatory Visit (INDEPENDENT_AMBULATORY_CARE_PROVIDER_SITE_OTHER): Payer: 59

## 2020-03-04 ENCOUNTER — Encounter: Payer: Self-pay | Admitting: Orthopaedic Surgery

## 2020-03-04 VITALS — BP 130/88 | HR 66 | Ht 68.0 in | Wt 188.0 lb

## 2020-03-04 DIAGNOSIS — M545 Low back pain, unspecified: Secondary | ICD-10-CM

## 2020-03-04 DIAGNOSIS — S300XXA Contusion of lower back and pelvis, initial encounter: Secondary | ICD-10-CM | POA: Diagnosis not present

## 2020-03-04 NOTE — Progress Notes (Signed)
Office Visit Note   Patient: Cole James           Date of Birth: [birth year]           MRN: [id number] Visit Date: 03/04/2020              Requested by: Colon Branch, [address] [place],  [place] [address] [address] PCP: Colon Branch, MD   Assessment & Plan: Visit Diagnoses:  1. Acute midline low back pain without sciatica   2. Contusion of sacrum, initial encounter     Plan: He will let us know if he has continued problems we discussed rest ice positioning, anti-inflammatory.  He will call if he has increased problems.  I offered some Ultram medication he will call if he feels like he needs it but would like to stay with Tylenol and Aleve.  Follow-Up Instructions: Return if symptoms worsen or fail to improve.   Orders:  Orders Placed This Encounter  Procedures  . XR Lumbar Spine 2-3 Views   No orders of the defined types were placed in this encounter.     Procedures: No procedures performed   Clinical Data: No additional findings.   Subjective: Chief Complaint  Patient presents with  . 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  He denies any numbness and tingling in his legs has had considerable amount of ecchymosis just off the midline.  He is used Aleve Tylenol Epson salts and topical cream.  No past history of injury pelvic fracture or disc problems.  He is normally has been active likes to play soccer.  Soccer season currently is delayed or closed due to Covid.  Patient denies loss of consciousness he did have a helmet on.  Review of Systems noncontributory to HPI.   Objective: Vital Signs: BP 130/88 (BP Location: Left Arm, Patient Position: Sitting, Cuff Size: Large)   Pulse 66   Ht 5\' 8"  (1.727 m)   Wt 188 lb (85.3 kg)   BMI  28.59 kg/m   Physical Exam Constitutional:      Appearance: He is well-developed.  HENT:     Head: Normocephalic and atraumatic.  Eyes:     Pupils: Pupils are equal, round, and reactive to light.  Neck:     Thyroid: No thyromegaly.     Trachea: No tracheal deviation.  Cardiovascular:     Rate and Rhythm: Normal rate.  Pulmonary:     Effort: Pulmonary effort is normal.     Breath sounds: No wheezing.  Abdominal:     General: Bowel sounds are normal.     Palpations: Abdomen is soft.  Skin:    General: Skin is warm and dry.     Capillary Refill: Capillary refill takes less than 2 seconds.  Neurological:     Mental Status: He is alert and oriented to person, place, and time.  Psychiatric:        Behavior: Behavior normal.        Thought Content: Thought content normal.        Judgment: Judgment normal.     Ortho Exam patient is able to get from sitting standing ambulate heel toe gait.  There is greater than 8 cm area of  ecchymosis adjacent to the right posterior iliac spine.  No ecchymosis in the midline.  Skin is intact.  Knee and ankle jerk are intact anterior tib EHL gastrocsoleus is active and has good strength.  Specialty Comments:  No specialty comments available.  Imaging: XR Lumbar Spine 2-3 Views  Result Date: 03/04/2020 AP and lateral lumbar spine x-rays obtained and reviewed.  This shows slight lumbar curvature negative for transverse process or pelvic fracture.  No evidence of compression fracture. Impression: Lumbar x-rays and upper pelvis negative for fracture.    PMFS History: Patient Active Problem List   Diagnosis Date Noted  . Sacral contusion 03/04/2020  . Tear of MCL (medial collateral ligament) of knee, left, initial encounter 04/04/2017  . GERD (gastroesophageal reflux disease) 12/07/2016  . PCP NOTES >>>>>>>>>>>>>>>>>>> 12/07/2016  . Annual physical exam 12/06/2016   Past Medical History:  Diagnosis Date  . Allergy    pet dander, seasonal,  apple, peach  . Asthma    childhood  . GERD (gastroesophageal reflux disease)   . History of kidney stones    age 59  . 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 Tear of MCL (medial collateral ligament) of knee    fluid aspiration 04/04/17  . Umbilical hernia     Family History  Problem Relation Age of Onset  . Hypertension Father   . Prostate cancer Maternal Grandfather   . Cancer Paternal Grandfather        cholangio Ca  . Colon cancer Neg Hx   . Diabetes Neg Hx   . CAD Neg Hx   . Colon polyps Neg Hx   . Rectal cancer Neg Hx   . Stomach cancer Neg Hx     Past Surgical History:  Procedure Laterality Date  . COLONOSCOPY  2012   in [place]- 2 benign polyps per pt = we have re[ort but no path   . HERNIA REPAIR     inguinal, age 59  . INSERTION OF MESH N/A 04/11/2017   Procedure: INSERTION OF MESH;  Surgeon: [name], MD;  [facility];  Service: General;  Laterality: N/A;  . UMBILICAL HERNIA REPAIR N/A 04/11/2017   Procedure: UMBILICAL HERNIA REPAIR;  Surgeon: [name], MD;  Location: [facility];  Service: General;  Laterality: N/A;   Social History   Occupational History  . Occupation: phyisician  Tobacco Use  . Smoking status: Never Smoker  . Smokeless tobacco: Never Used  Substance and Sexual Activity  . Alcohol use: Yes    Alcohol/week: 4.0 standard drinks    Types: 4 Shots of liquor per week    Comment: Social  . Drug use: No  . Sexual activity: Not on file

## 2020-05-02 ENCOUNTER — Other Ambulatory Visit: Payer: Self-pay | Admitting: Internal Medicine

## 2020-05-02 ENCOUNTER — Encounter: Payer: Self-pay | Admitting: Internal Medicine

## 2020-05-02 MED ORDER — ALBUTEROL SULFATE HFA 108 (90 BASE) MCG/ACT IN AERS: 1.0000 | INHALATION_SPRAY | Freq: Four times a day (QID) | RESPIRATORY_TRACT | 1 refills | Status: DC | PRN

## 2020-05-02 MED ORDER — CIPROFLOXACIN HCL 500 MG PO TABS: 500.0000 mg | ORAL_TABLET | Freq: Two times a day (BID) | ORAL | 0 refills | Status: DC

## 2020-05-02 MED FILL — CIPROFLOXACIN HCL 500 MG TA: 500 | 3 days supply | Qty: 6 | Fill #0

## 2020-05-02 MED FILL — ALBUTEROL SULFATE HFA 108 (: 108 (90 BAS | 16 days supply | Qty: 18 | Fill #0

## 2020-05-03 ENCOUNTER — Other Ambulatory Visit: Payer: Self-pay | Admitting: Internal Medicine

## 2020-05-03 MED ORDER — MUPIROCIN 2 % EX OINT: 1.0000 "application " | TOPICAL_OINTMENT | Freq: Two times a day (BID) | CUTANEOUS | 2 refills | Status: DC | PRN

## 2020-05-03 MED FILL — MUPIROCIN 2% OINTMENT: 2 | 10 days supply | Qty: 22 | Fill #0

## 2020-05-03 NOTE — Progress Notes (Signed)
Bactroban 

## 2020-07-19 ENCOUNTER — Ambulatory Visit: Payer: 59 | Attending: Internal Medicine

## 2020-07-19 DIAGNOSIS — Z23 Encounter for immunization: Secondary | ICD-10-CM

## 2020-07-19 NOTE — Progress Notes (Signed)
   Covid-19 Vaccination Clinic  Name:  Cole James    MRN: [id number] DOB: 01-16-1961  07/19/2020  Mr. Cole James was observed post Covid-19 immunization for 15 minutes without incident. He was provided with Vaccine Information Sheet and instruction to access the V-Safe system.   Mr. Cole James was instructed to call 911 with any severe reactions post vaccine: Marland Kitchen Difficulty breathing  . Swelling of face and throat  . A fast heartbeat  . A bad rash all over body  . Dizziness and weakness

## 2020-09-11 ENCOUNTER — Other Ambulatory Visit: Payer: Self-pay

## 2020-09-11 ENCOUNTER — Encounter (HOSPITAL_COMMUNITY): Payer: Self-pay

## 2020-09-11 ENCOUNTER — Ambulatory Visit (HOSPITAL_COMMUNITY)
Admission: EM | Admit: 2020-09-11 | Discharge: 2020-09-11 | Disposition: A | Discharge status: Home / Self Care | Payer: 59 | Attending: Family Medicine | Admitting: Family Medicine

## 2020-09-11 DIAGNOSIS — S6010XA Contusion of unspecified finger with damage to nail, initial encounter: Secondary | ICD-10-CM

## 2020-09-11 NOTE — ED Provider Notes (Signed)
[facility]    CSN: [id number] Arrival date & time: 09/11/20  1104      History   Chief Complaint Chief Complaint  Cole James presents with  . Finger Injury    Cole James is a 59 y.o. male.   Cole  Cole James accidentally slammed right fourth finger in car door.  Has a subungual hematoma.  Painful.  He would like this treated  Past Medical History:  Diagnosis Date  . Allergy    pet dander, seasonal, apple, peach  . Asthma    childhood  . GERD (gastroesophageal reflux disease)   . History of kidney stones    age 13  . 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 Tear of MCL (medial collateral ligament) of knee    fluid aspiration 04/04/17  . Umbilical hernia     Cole James Active Problem List   Diagnosis Date Noted  . Sacral contusion 03/04/2020  . Tear of MCL (medial collateral ligament) of knee, left, initial encounter 04/04/2017  . GERD (gastroesophageal reflux disease) 12/07/2016  . PCP NOTES >>>>>>>>>>>>>>>>>>> 12/07/2016  . Annual physical exam 12/06/2016    Past Surgical History:  Procedure Laterality Date  . COLONOSCOPY  2012   in [place]- 2 benign polyps per pt = we have re[ort but no path   . HERNIA REPAIR     inguinal, age 89  . INSERTION OF MESH N/A 04/11/2017   Procedure: INSERTION OF MESH;  Surgeon: [name], MD;  Location: [facility];  Service: General;  Laterality: N/A;  . UMBILICAL HERNIA REPAIR N/A 04/11/2017   Procedure: UMBILICAL HERNIA REPAIR;  Surgeon: [name], MD;  Location: [facility];  Service: General;  Laterality: N/A;       Home Medications    Prior to Admission medications   Medication Sig Start Date End Date Taking? Authorizing Provider  fexofenadine (ALLEGRA) 30 MG tablet Take 30 mg by mouth as needed.   Yes [provider]  albuterol (VENTOLIN HFA) 108 (90 Base) MCG/ACT inhaler Inhale 1-2 puffs into the lungs every 6 (six) hours as needed for wheezing or shortness of  breath. 05/02/20   Colon Branch, MD  finasteride (PROPECIA) 1 MG tablet Take 1 mg by mouth daily. 11/17/19   [provider]  fluticasone (FLONASE) 50 MCG/ACT nasal spray Place 1 spray into both nostrils as needed for allergies or rhinitis.    [provider]  mupirocin ointment (BACTROBAN) 2 % Apply 1 application topically 2 (two) times daily as needed. 05/03/20   Colon Branch, MD  QUEtiapine (SEROQUEL) 400 MG tablet  04/02/20 09/11/20  [provider]    Family History Family History  Problem Relation Age of Onset  . Hypertension Father   . Prostate cancer Maternal Grandfather   . Cancer Paternal Grandfather        cholangio Ca  . Colon cancer Neg Hx   . Diabetes Neg Hx   . CAD Neg Hx   . Colon polyps Neg Hx   . Rectal cancer Neg Hx   . Stomach cancer Neg Hx     Social History Social History   Tobacco Use  . Smoking status: Never Smoker  . Smokeless tobacco: Never Used  Vaping Use  . Vaping Use: Never used  Substance Use Topics  . Alcohol use: Yes    Alcohol/week: 4.0 standard drinks    Types: 4 Shots of liquor per week    Comment:  Social  . Drug use: No     Allergies   Apple, Latex, and Peach [prunus persica]   Review of Systems Review of Systems See Cole  Physical Exam Triage Vital Signs ED Triage Vitals  Enc Vitals Group     BP 09/11/20 1124 131/80     Pulse Rate 09/11/20 1124 70     Resp 09/11/20 1124 18     Temp 09/11/20 1124 97.8 F (36.6 C)     Temp Source 09/11/20 1124 Oral     SpO2 09/11/20 1124 99 %     Weight --      Height --      Head Circumference --      Peak Flow --      Pain Score 09/11/20 1121 6     Pain Loc --      Pain Edu? --      Excl. in [place]? --    No data found.  Updated Vital Signs BP 131/80 (BP Location: Left Arm)   Pulse 70   Temp 97.8 F (36.6 C) (Oral)   Resp 18   SpO2 99%       Physical Exam Constitutional:      General: He is not in acute distress.    Appearance: He is well-developed.      Comments: Exam by observation.  Mask in place  HENT:     Head: Normocephalic and atraumatic.  Eyes:     Conjunctiva/sclera: Conjunctivae normal.     Pupils: Pupils are equal, round, and reactive to light.  Cardiovascular:     Rate and Rhythm: Normal rate.  Pulmonary:     Effort: Pulmonary effort is normal. No respiratory distress.  Abdominal:     Palpations: Abdomen is soft.  Musculoskeletal:        General: Normal range of motion.     Cervical back: Normal range of motion.  Skin:    General: Skin is warm and dry.     Comments: Right fourth finger has a subungual hematoma of the proximal half of the nail.  Neurological:     Mental Status: He is alert.  Psychiatric:        Mood and Affect: Mood normal.        Behavior: Behavior normal.    Procedure: Area is cleansed with Betadine.  The nail is perforated with Bovie cautery with good result.  Band-Aid placed UC Treatments / Results  Labs (all labs ordered are listed, but only abnormal results are displayed) Labs Reviewed - No data to display  EKG   Radiology No results found.  Procedures Procedures (including critical care time)  Medications Ordered in UC Medications - No data to display  Initial Impression / Assessment and Plan / UC Course  I have reviewed the triage vital signs and the nursing notes.  Pertinent labs & imaging results that were available during my care of the Cole James were reviewed by me and considered in my medical decision making (see chart for details).     Final Clinical Impressions(s) / UC Diagnoses   Final diagnoses:  Subungual hematoma of finger, initial encounter     Discharge Instructions     It was a pleasure seeing you today Watch for infection Return as needed   ED Prescriptions    None     PDMP not reviewed this encounter.   Raylene Everts, MD 09/11/20 1228

## 2020-09-11 NOTE — ED Triage Notes (Signed)
Pt c/o pain to right 4th finger 2/2 slamming finger in car door last night. Denies bleeding at time of injury.   Large area of ecchymosis and redness to finger with edema under nailbed. No meds PTA.

## 2020-09-11 NOTE — Discharge Instructions (Addendum)
It was a pleasure seeing you today Watch for infection Return as needed

## 2020-10-26 ENCOUNTER — Other Ambulatory Visit: Payer: 59

## 2020-12-26 ENCOUNTER — Encounter: Payer: Self-pay | Admitting: Internal Medicine

## 2020-12-26 ENCOUNTER — Other Ambulatory Visit: Payer: Self-pay | Admitting: Internal Medicine

## 2020-12-26 ENCOUNTER — Other Ambulatory Visit (HOSPITAL_COMMUNITY): Payer: Self-pay | Admitting: Internal Medicine

## 2020-12-26 MED ORDER — PREDNISONE 10 MG PO TABS
ORAL_TABLET | ORAL | 1 refills | Status: DC
Start: 2020-12-26 — End: 2021-06-20

## 2020-12-26 MED FILL — predniSONE 10 MG TABS: 10 | 9 days supply | Qty: 18 | Fill #0

## 2021-01-01 DIAGNOSIS — Z20822 Contact with and (suspected) exposure to covid-19: Secondary | ICD-10-CM | POA: Diagnosis not present

## 2021-01-02 ENCOUNTER — Encounter: Payer: Self-pay | Admitting: Internal Medicine

## 2021-01-02 ENCOUNTER — Ambulatory Visit (INDEPENDENT_AMBULATORY_CARE_PROVIDER_SITE_OTHER): Payer: 59 | Admitting: Internal Medicine

## 2021-01-02 ENCOUNTER — Other Ambulatory Visit: Payer: Self-pay

## 2021-01-02 VITALS — BP 128/76 | HR 66 | Temp 97.8°F | Resp 16 | Ht 68.0 in | Wt 189.0 lb

## 2021-01-02 DIAGNOSIS — Z125 Encounter for screening for malignant neoplasm of prostate: Secondary | ICD-10-CM | POA: Diagnosis not present

## 2021-01-02 DIAGNOSIS — Z1159 Encounter for screening for other viral diseases: Secondary | ICD-10-CM | POA: Diagnosis not present

## 2021-01-02 DIAGNOSIS — Z113 Encounter for screening for infections with a predominantly sexual mode of transmission: Secondary | ICD-10-CM

## 2021-01-02 DIAGNOSIS — Z0000 Encounter for general adult medical examination without abnormal findings: Secondary | ICD-10-CM | POA: Diagnosis not present

## 2021-01-02 LAB — COMPREHENSIVE METABOLIC PANEL
ALT: 44 U/L (ref 0–53)
AST: 25 U/L (ref 0–37)
Albumin: 4.3 g/dL (ref 3.5–5.2)
Alkaline Phosphatase: 51 U/L (ref 39–117)
BUN: 18 mg/dL (ref 6–23)
CO2: 28 mEq/L (ref 19–32)
Calcium: 9.3 mg/dL (ref 8.4–10.5)
Chloride: 104 mEq/L (ref 96–112)
Creatinine, Ser: 1.18 mg/dL (ref 0.40–1.50)
GFR: 67.32 mL/min (ref >60.00)
Glucose, Bld: 97 mg/dL (ref 70–99)
Potassium: 4.2 mEq/L (ref 3.5–5.1)
Sodium: 138 mEq/L (ref 135–145)
Total Bilirubin: 0.9 mg/dL (ref 0.2–1.2)
Total Protein: 7 g/dL (ref 6.0–8.3)

## 2021-01-02 LAB — CBC WITH DIFFERENTIAL/PLATELET
Basophils Absolute: 0.1 10*3/uL (ref 0.0–0.1)
Basophils Relative: 0.9 % (ref 0.0–3.0)
Eosinophils Absolute: 0.1 10*3/uL (ref 0.0–0.7)
Eosinophils Relative: 2 % (ref 0.0–5.0)
HCT: 46.1 % (ref 39.0–52.0)
Hemoglobin: 15.9 g/dL (ref 13.0–17.0)
Lymphocytes Relative: 25.7 % (ref 12.0–46.0)
Lymphs Abs: 1.5 10*3/uL (ref 0.7–4.0)
MCHC: 34.4 g/dL (ref 30.0–36.0)
MCV: 87.7 fl (ref 78.0–100.0)
Monocytes Absolute: 0.5 10*3/uL (ref 0.1–1.0)
Monocytes Relative: 9 % (ref 3.0–12.0)
Neutro Abs: 3.6 10*3/uL (ref 1.4–7.7)
Neutrophils Relative %: 62.4 % (ref 43.0–77.0)
Platelets: 155 10*3/uL (ref 150.0–400.0)
RBC: 5.26 Mil/uL (ref 4.22–5.81)
RDW: 13.4 % (ref 11.5–15.5)
WBC: 5.7 10*3/uL (ref 4.0–10.5)

## 2021-01-02 LAB — LIPID PANEL
Cholesterol: 238 mg/dL — ABNORMAL HIGH (ref 0–200)
HDL: 44 mg/dL (ref >39.00)
LDL Cholesterol: 156 mg/dL — ABNORMAL HIGH (ref 0–99)
NonHDL: 193.54
Total CHOL/HDL Ratio: 5
Triglycerides: 186 mg/dL — ABNORMAL HIGH (ref 0.0–149.0)
VLDL: 37.2 mg/dL (ref 0.0–40.0)

## 2021-01-02 LAB — PSA: PSA: 2.12 ng/mL (ref 0.10–4.00)

## 2021-01-02 LAB — TSH: TSH: 1.85 u[IU]/mL (ref 0.35–4.50)

## 2021-01-02 NOTE — Progress Notes (Unsigned)
Subjective:    Patient ID: Cole James, male    DOB: 09/05/1961, 60 y.o.   MRN: [id number]  DOS:  01/02/2021 Type of visit - description: CPX Since the last office is doing well.  Has no major concerns.   BP Readings from Last 3 Encounters:  01/02/21 128/76  09/11/20 131/80  03/04/20 130/88    Review of Systems    A 14 point review of systems is negative    Past Medical History:  Diagnosis Date  . Allergy    pet dander, seasonal, apple, peach  . Asthma    childhood  . GERD (gastroesophageal reflux disease)   . History of kidney stones    age 27  . 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 Tear of MCL (medial collateral ligament) of knee    fluid aspiration 04/04/17  . Umbilical hernia     Past Surgical History:  Procedure Laterality Date  . COLONOSCOPY  2012   in [place]- 2 benign polyps per pt = we have re[ort but no path   . HERNIA REPAIR     inguinal, age 44  . INSERTION OF MESH N/A 04/11/2017   Procedure: INSERTION OF MESH;  Surgeon: [name], MD;  [facility]: [facility];  Service: General;  Laterality: N/A;  . UMBILICAL HERNIA REPAIR N/A 04/11/2017   Procedure: UMBILICAL HERNIA REPAIR;  Surgeon: [name], MD;  Location: [facility];  Service: General;  Laterality: N/A;    Allergies as of 01/02/2021      Reactions   Apple Other (See Comments)   Unspecified; noted in records received on 07/08/2017   Latex Dermatitis   Peach [prunus Persica] Other (See Comments)   Unspecified, noted in records received on 07/08/2017      Medication List       Accurate as of January 02, 2021 11:59 PM. If you have any questions, ask your nurse or doctor.        STOP taking these medications   finasteride 1 MG tablet Commonly known as: PROPECIA Stopped by: Kathlene November, MD   mupirocin ointment 2 % Commonly known as: Bactroban Stopped by: Kathlene November, MD   QUEtiapine 400 MG tablet Commonly known as: SEROQUEL Stopped by: Kathlene November, MD      TAKE these medications   albuterol 108 (90 Base) MCG/ACT inhaler Commonly known as: VENTOLIN HFA Inhale 1-2 puffs into the lungs every 6 (six) hours as needed for wheezing or shortness of breath.   fexofenadine 30 MG tablet Commonly known as: ALLEGRA Take 30 mg by mouth as needed.   fluticasone 50 MCG/ACT nasal spray Commonly known as: FLONASE Place 1 spray into both nostrils as needed for allergies or rhinitis.   predniSONE 10 MG tablet Commonly known as: DELTASONE 3 tabs x 3 days, 2 tabs x 3 days, 1 tab x 3 days          Objective:   Physical Exam BP 128/76 (BP Location: Left Arm, Patient Position: Sitting, Cuff Size: Small)   Pulse 66   Temp 97.8 F (36.6 C) (Oral)   Resp 16   Ht 5\' 8"  (1.727 m)   Wt 189 lb (85.7 kg)   SpO2 98%   BMI 28.74 kg/m  General: Well developed, NAD, BMI noted Neck: No  thyromegaly  HEENT:  Normocephalic . Face symmetric, atraumatic Lungs:  CTA B Normal respiratory effort, no intercostal retractions, no accessory muscle use. Heart: RRR,  no murmur.  Abdomen:  Not distended, soft, non-tender. No rebound or rigidity.   Lower extremities: no pretibial edema bilaterally  Skin: Exposed areas without rash. Not pale. Not jaundice DRE: Normal sphincter tone, no stools, prostate normal. Neurologic:  alert & oriented X3.  Speech normal, gait appropriate for age and unassisted Strength symmetric and appropriate for age.  Psych: Cognition and judgment appear intact.  Cooperative with normal attention span and concentration.  Behavior appropriate. No anxious or depressed appearing.     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 Doing very well. Chronic medical problems: Essentially asx, rarely uses albuterol and has self started prednisone prescription for allergies. RTC 1  year.    This visit occurred during the SARS-CoV-2 public health emergency.  Safety protocols were in place, including screening questions prior to the visit, additional usage of staff PPE, and extensive cleaning of exam room while observing appropriate contact time as indicated for disinfecting solutions.

## 2021-01-02 NOTE — Patient Instructions (Addendum)
GO TO THE LAB : Get the blood work    [facility], [place] Come back for   a physical exam in 1 year   Advance Directive  Advance directives are legal documents that allow you to make decisions about your health care and medical treatment in case you become unable to communicate for yourself. Advance directives let your wishes be known to family, friends, and health care providers. Discussing and writing advance directives should happen over time rather than all at once. Advance directives can be changed and updated at any time. There are different types of advance directives, such as:  Medical power of attorney.  Living will.  Do not resuscitate (DNR) order or do not attempt resuscitation (DNAR) order. Health care proxy and medical power of attorney A health care proxy is also called a health care agent. This person is appointed to make medical decisions for you when you are unable to make decisions for yourself. Generally, people ask a trusted friend or family member to act as their proxy and represent their preferences. Make sure you have an agreement with your trusted person to act as your proxy. A proxy may have to make a medical decision on your behalf if your wishes are not known. A medical power of attorney, also called a durable power of attorney for health care, is a legal document that names your health care proxy. Depending on the laws in your state, the document may need to be:  Signed.  Notarized.  Dated.  Copied.  Witnessed.  Incorporated into your medical record. You may also want to appoint a trusted person to manage your money in the event you are unable to do so. This is called a durable power of attorney for finances. It is a separate legal document from the durable power of attorney for health care. You may choose your health care proxy or someone different to act as your agent in money matters. If you do not appoint a proxy, or  there is a concern that the proxy is not acting in your best interest, a court may appoint a guardian to act on your behalf. Living will A living will is a set of instructions that state your wishes about medical care when you cannot express them yourself. Health care providers should keep a copy of your living will in your medical record. You may want to give a copy to family members or friends. To alert caregivers in case of an emergency, you can place a card in your wallet to let them know that you have a living will and where they can find it. A living will is used if you become:  Terminally ill.  Disabled.  Unable to communicate or make decisions. The following decisions should be included in your living will:  To use or not to use life support equipment, such as dialysis machines and breathing machines (ventilators).  Whether you want a DNR or DNAR order. This tells health care providers not to use cardiopulmonary resuscitation (CPR) if breathing or heartbeat stops.  To use or not to use tube feeding.  To be given or not to be given food and fluids.  Whether you want comfort (palliative) care when the goal becomes comfort rather than a cure.  Whether you want to donate your organs and tissues. A living will does not give instructions for distributing your money and property if you should pass away. DNR or DNAR A DNR or  DNAR order is a request not to have CPR in the event that your heart stops beating or you stop breathing. If a DNR or DNAR order has not been made and shared, a health care provider will try to help any patient whose heart has stopped or who has stopped breathing. If you plan to have surgery, talk with your health care provider about how your DNR or DNAR order will be followed if problems occur. What if I do not have an advance directive? Some states assign family decision makers to act on your behalf if you do not have an advance directive. Each state has its own laws  about advance directives. You may want to check with your health care provider, attorney, or state representative about the laws in your state. Summary  Advance directives are legal documents that allow you to make decisions about your health care and medical treatment in case you become unable to communicate for yourself.  The process of discussing and writing advance directives should happen over time. You can change and update advance directives at any time.  Advance directives may include a medical power of attorney, a living will, and a DNR or DNAR order. This information is not intended to replace advice given to you by your health care provider. Make sure you discuss any questions you have with your health care provider. Document Revised: 07/12/2020 Document Reviewed: 07/12/2020 Elsevier Patient Education  2021 [employer].

## 2021-01-03 ENCOUNTER — Encounter: Payer: Self-pay | Admitting: Internal Medicine

## 2021-01-03 DIAGNOSIS — L918 Other hypertrophic disorders of the skin: Secondary | ICD-10-CM

## 2021-01-03 LAB — HIV ANTIBODY (ROUTINE TESTING W REFLEX): HIV 1&2 Ab, 4th Generation: NONREACTIVE

## 2021-01-03 LAB — RPR: RPR Ser Ql: NONREACTIVE

## 2021-01-03 LAB — HEPATITIS C ANTIBODY
Hepatitis C Ab: NONREACTIVE
SIGNAL TO CUT-OFF: 0.01 (ref <1.00)

## 2021-01-03 NOTE — Assessment & Plan Note (Signed)
-  Td 2018, pnm 23: 2018; s/p  shingrix x 2 -COVID vaccines x3 -Had a flu shot - CCS: Cscope 2010 wnl per pt. C-scope 01/2020, 10 years per letter - Prostate ca screening: DRE normal, no symptoms, check PSA - Labs:  CMP, FLP, CBC, TSH,PSA.  Also request HIV, RPR and hep C. -Diet exercise: Doing well, trying to watch his diet, exercises regularly, goes to the gym. -Advance directives discussed -CV RF based on last FLP 10.9%.  statins could be indicated.  Another way to assess his risk would be a coronary calcium score.

## 2021-01-03 NOTE — Assessment & Plan Note (Signed)
Here for CPX. Doing very well. Chronic medical problems: Essentially asx, rarely uses albuterol and has self started prednisone prescription for allergies. RTC 1 year.

## 2021-01-12 ENCOUNTER — Telehealth: Payer: Self-pay | Admitting: Dermatology

## 2021-01-12 NOTE — Telephone Encounter (Signed)
Patient is calling for a referral appointment from Kathlene November, MD.  Patient is scheduled for 06/20/2021 at 2:00 with Lavonna Monarch, MD.

## 2021-01-12 NOTE — Telephone Encounter (Signed)
Referral attached to appointment

## 2021-01-18 ENCOUNTER — Other Ambulatory Visit (HOSPITAL_COMMUNITY): Payer: Self-pay | Admitting: *Deleted

## 2021-01-23 ENCOUNTER — Other Ambulatory Visit: Payer: Self-pay

## 2021-01-23 ENCOUNTER — Ambulatory Visit (HOSPITAL_BASED_OUTPATIENT_CLINIC_OR_DEPARTMENT_OTHER)
Admission: RE | Admit: 2021-01-23 | Discharge: 2021-01-23 | Disposition: A | Discharge status: Home / Self Care | Payer: Self-pay | Source: Ambulatory Visit | Attending: Cardiology | Admitting: Cardiology

## 2021-01-27 ENCOUNTER — Encounter: Payer: Self-pay | Admitting: Internal Medicine

## 2021-01-27 DIAGNOSIS — R911 Solitary pulmonary nodule: Secondary | ICD-10-CM

## 2021-02-08 ENCOUNTER — Encounter (HOSPITAL_BASED_OUTPATIENT_CLINIC_OR_DEPARTMENT_OTHER): Payer: Self-pay

## 2021-02-08 ENCOUNTER — Ambulatory Visit: Payer: 59 | Attending: Internal Medicine

## 2021-02-08 ENCOUNTER — Other Ambulatory Visit: Payer: Self-pay

## 2021-02-08 ENCOUNTER — Ambulatory Visit (HOSPITAL_BASED_OUTPATIENT_CLINIC_OR_DEPARTMENT_OTHER)
Admission: RE | Admit: 2021-02-08 | Discharge: 2021-02-08 | Disposition: A | Discharge status: Home / Self Care | Payer: 59 | Source: Ambulatory Visit | Attending: Internal Medicine | Admitting: Internal Medicine

## 2021-02-08 DIAGNOSIS — R918 Other nonspecific abnormal finding of lung field: Secondary | ICD-10-CM | POA: Diagnosis not present

## 2021-02-08 DIAGNOSIS — R911 Solitary pulmonary nodule: Secondary | ICD-10-CM | POA: Diagnosis not present

## 2021-02-08 DIAGNOSIS — Z23 Encounter for immunization: Secondary | ICD-10-CM

## 2021-02-08 NOTE — Progress Notes (Signed)
   Covid-19 Vaccination Clinic  Name:  Cole James    MRN: [id number] DOB: Mar 24, 1961  02/08/2021  [name] was observed post Covid-19 immunization for 15 minutes without incident. He was provided with Vaccine Information Sheet and instruction to access the V-Safe system.   [name] was instructed to call 911 with any severe reactions post vaccine: Marland Kitchen Difficulty breathing  . Swelling of face and throat  . A fast heartbeat  . A bad rash all over body  . Dizziness and weakness   Immunizations Administered    Name Date Dose VIS Date Route   PFIZER Comrnaty(Gray TOP) Covid-19 Vaccine 02/08/2021 11:10 AM 0.3 mL 09/29/2020 Intramuscular   Manufacturer: [employer]   Lot: [id number]   NDC: [phone]

## 2021-02-09 ENCOUNTER — Other Ambulatory Visit: Payer: Self-pay

## 2021-02-09 MED ORDER — MONTELUKAST SODIUM 10 MG PO TABS: 10.0000 mg | ORAL_TABLET | Freq: Every evening | ORAL | 6 refills | Status: DC | PRN

## 2021-02-10 NOTE — Addendum Note (Signed)
Addended byDamita Dunnings D on: 02/10/2021 08:47 AM   Modules accepted: Orders

## 2021-02-22 ENCOUNTER — Encounter: Payer: Self-pay | Admitting: Emergency Medicine

## 2021-02-22 ENCOUNTER — Ambulatory Visit: Payer: 59 | Admitting: Emergency Medicine

## 2021-02-22 ENCOUNTER — Other Ambulatory Visit: Payer: Self-pay

## 2021-02-22 DIAGNOSIS — R918 Other nonspecific abnormal finding of lung field: Secondary | ICD-10-CM | POA: Diagnosis not present

## 2021-02-22 NOTE — Progress Notes (Signed)
Subjective:    Patient ID: Cole Flaming, MD, male    DOB: 1960-11-18, 60 y.o.   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  He has a history of childhood asthma, allergic rhinitis, GERD, remote renal calculi.  He has had a positive PPD, but he had the BCG, never required any treatment.   He is very active, exercises. No CP, no complaints.   He underwent a CT calcium score evaluation 01/23/2021 which I have reviewed, showed a well-circumscribed left lower lobe subpleural nodule 6 x 8 mm, some nonspecific right lower lobe scar  Dedicated CT chest without contrast 02/09/2021 reviewed by me, shows the left lower lobe pleural-based nodule, 7 mm as well as other small nodules including 5 mm subpleural right middle lobe, 5 mm right lower lobe, 2 mm left lower lobe.  No other concerning findings.   Review of Systems As per [facility]  Past Medical History:  Diagnosis Date  . Allergy    pet dander, seasonal, apple, peach  . Asthma    childhood  . GERD (gastroesophageal reflux disease)   . History of kidney stones    age 1  . 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 Tear of MCL (medial collateral ligament) of knee    fluid aspiration 04/04/17  . Umbilical hernia      Family History  Problem Relation Age of Onset  . Hypertension Father   . Prostate cancer Maternal Grandfather   . Cancer Paternal Grandfather        cholangio Ca  . Colon cancer Neg Hx   . Diabetes Neg Hx   . CAD Neg Hx   . Colon polyps Neg Hx   . Rectal cancer Neg Hx   . Stomach cancer Neg Hx     No history of lung cancer  Social History   Socioeconomic History  . Marital status: Divorced    Spouse name: Not on file  . Number of children: 2  . Years of education: Not on file  . Highest education level: Not on file  Occupational History  . Occupation: phyisician  Tobacco Use  . Smoking status: Never Smoker  . Smokeless tobacco: Never Used  Vaping Use  . Vaping Use:  Never used  Substance and Sexual Activity  . Alcohol use: Yes    Alcohol/week: 4.0 standard drinks    Types: 4 Shots of liquor per week    Comment: Social  . Drug use: No  . Sexual activity: Not on file  Other Topics Concern  . 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 Apple Other (See Comments)    Unspecified; noted in records received on  07/08/2017  . Latex Dermatitis  . Peach [Prunus Persica] Other (See Comments)    Unspecified, noted in records received on 07/08/2017     Outpatient Medications Prior to Visit  Medication Sig Dispense Refill  . albuterol (VENTOLIN HFA) 108 (90 Base) MCG/ACT inhaler Inhale 1-2 puffs into the lungs every 6 (six) hours as needed for wheezing or shortness of breath. 18 g 1  . fexofenadine (ALLEGRA) 30 MG tablet Take 30 mg by mouth as needed.    . fluticasone (FLONASE) 50 MCG/ACT nasal spray Place 1 spray into both nostrils as needed for allergies or rhinitis.    . predniSONE (DELTASONE) 10 MG tablet 3 tabs x 3 days, 2 tabs x 3 days, 1 tab x 3 days 18 tablet 1  . montelukast (SINGULAIR) 10 MG tablet Take 1 tablet (10 mg total) by mouth at bedtime as needed. (Patient not taking: Reported on 02/22/2021) 30 tablet 6  . predniSONE (DELTASONE) 10 MG tablet TAKE 3 TABLETS BY MOUTH ONCE A DAY FOR 3  DAYS THEN 2 TABLETS FOR 3 DAYS THEN 1 TABLET FOR 3 DAYS 18 tablet 1   No facility-administered medications prior to visit.         Objective:   Physical Exam Vitals:   02/22/21 1538  BP: (!) 142/82  Pulse: 64  Temp: 97.7 F (36.5 C)  TempSrc: Temporal  SpO2: 97%  Weight: 192 lb (87.1 kg)  Height: 5\' 8"  (1.727 m)   Gen: Pleasant, well-nourished, in no distress,  normal affect  ENT: No lesions,  mouth clear,  oropharynx clear, no postnasal drip  Neck: No JVD, no stridor  Lungs: No use of accessory muscles, no crackles or wheezing on normal respiration, no wheeze on forced expiration  Cardiovascular: RRR, heart sounds normal, no murmur or gallops, no peripheral edema  Musculoskeletal: No deformities, no cyanosis or clubbing  Neuro: alert, awake, non focal  Skin: Warm, no lesions or rash     Assessment & Plan:  Pulmonary nodules/lesions, multiple Small subpleural pulmonary nodules noted on CT chest, have the appearance subpleural nodes, largest is 7 mm in the left lower lobe.  He has a history of a positive PPD but he did get the BCG.  His chest x-rays were always unremarkable and he did not require treatment for latent TB.  Discussed the possible etiologies, suspect that these are most likely benign findings.  Based on risk criteria (he is low risk) we will repeat a CT chest in 6 months and then probably at the 2-year mark.  Plan to follow for 2 years of stability.  If there is a clinical change we may decide to image sooner.  If his nodules evolve or change our index of suspicion then we will plan appropriate diagnostics.   Baltazar Apo, MD, PhD 02/22/2021, 4:10 PM [facility] [phone] or if no answer before 7:00PM call [phone] For any issues after 7:00PM please call eLink [phone]

## 2021-02-22 NOTE — Assessment & Plan Note (Signed)
Small subpleural pulmonary nodules noted on CT chest, have the appearance subpleural nodes, largest is 7 mm in the left lower lobe.  He has a history of a positive PPD but he did get the BCG.  His chest x-rays were always unremarkable and he did not require treatment for latent TB.  Discussed the possible etiologies, suspect that these are most likely benign findings.  Based on risk criteria (he is low risk) we will repeat a CT chest in 6 months and then probably at the 2-year mark.  Plan to follow for 2 years of stability.  If there is a clinical change we may decide to image sooner.  If his nodules evolve or change our index of suspicion then we will plan appropriate diagnostics.

## 2021-02-22 NOTE — Patient Instructions (Addendum)
We will plan to repeat your CT chest in October 2022 to compare with your prior.  Follow with Dr Lamonte Sakai in office or by phone to review the results.  Call if you have any new respiratory issues.

## 2021-02-22 NOTE — Addendum Note (Signed)
Addended by: Gavin Potters R on: 02/22/2021 04:18 PM   Modules accepted: Orders

## 2021-02-27 ENCOUNTER — Ambulatory Visit: Payer: 59 | Admitting: Dermatology

## 2021-05-23 ENCOUNTER — Other Ambulatory Visit: Payer: Self-pay | Admitting: Internal Medicine

## 2021-05-23 ENCOUNTER — Other Ambulatory Visit: Payer: Self-pay

## 2021-05-23 ENCOUNTER — Encounter: Payer: Self-pay | Admitting: Internal Medicine

## 2021-05-23 MED ORDER — CIPROFLOXACIN HCL 500 MG PO TABS: 500.0000 mg | ORAL_TABLET | Freq: Two times a day (BID) | ORAL | 0 refills | Status: DC

## 2021-05-23 MED ORDER — ALBUTEROL SULFATE HFA 108 (90 BASE) MCG/ACT IN AERS: 1.0000 | INHALATION_SPRAY | Freq: Four times a day (QID) | RESPIRATORY_TRACT | 5 refills | Status: DC | PRN

## 2021-05-24 ENCOUNTER — Other Ambulatory Visit: Payer: Self-pay | Admitting: Internal Medicine

## 2021-05-24 MED ORDER — CIPROFLOXACIN HCL 500 MG PO TABS: 500.0000 mg | ORAL_TABLET | Freq: Two times a day (BID) | ORAL | 0 refills | Status: DC

## 2021-06-20 ENCOUNTER — Ambulatory Visit: Payer: 59 | Admitting: Dermatology

## 2021-06-20 ENCOUNTER — Other Ambulatory Visit: Payer: Self-pay

## 2021-06-20 DIAGNOSIS — L918 Other hypertrophic disorders of the skin: Secondary | ICD-10-CM | POA: Diagnosis not present

## 2021-06-20 DIAGNOSIS — L821 Other seborrheic keratosis: Secondary | ICD-10-CM | POA: Diagnosis not present

## 2021-06-20 DIAGNOSIS — Z1283 Encounter for screening for malignant neoplasm of skin: Secondary | ICD-10-CM

## 2021-06-20 DIAGNOSIS — D1801 Hemangioma of skin and subcutaneous tissue: Secondary | ICD-10-CM | POA: Diagnosis not present

## 2021-06-27 ENCOUNTER — Encounter (INDEPENDENT_AMBULATORY_CARE_PROVIDER_SITE_OTHER): Payer: Self-pay

## 2021-06-27 ENCOUNTER — Encounter: Payer: Self-pay | Admitting: Dermatology

## 2021-06-27 NOTE — Progress Notes (Signed)
   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 Has one under left eye, under right axilla and one near is buttock. ).  Skin check plus possible removal of skin tags. Location:  Duration:  Quality:  Associated Signs/Symptoms: Modifying Factors:  Severity:  Timing: Context:    The following portions of the chart were reviewed this encounter and updated as appropriate:  Tobacco  Allergies  Meds  Problems  Med Hx  Surg Hx  Fam Hx      Objective  Well appearing patient in no apparent distress; mood and affect are within normal limits. Mid Back, Neck - Posterior 4 to 6 mm flattopped brown textured papules  Chest - Medial (Center), Mid Back 2 mm smooth dermal papules.  Mid Back Waist up skin exam. No skin cancer or atypical moles.   Left Malar Cheek, Right Axilla Fleshy, skin-colored sessile and pedunculated papules.  Snap at patient's request.  Discouraged from trying over-the-counter topical therapies.    All skin waist up examined.   Assessment & Plan  Seborrheic keratosis (2) Neck - Posterior; Mid Back  Benign no treatment needed.   Hemangioma of skin (2) Chest - Medial [facility]); Mid Back  Benign no treatment needed.   Screening for malignant neoplasm of skin Mid Back  Skin exams as needed.  Encouraged to self examine twice annually.  Skin tag (2) Right Axilla; Left Malar Cheek  Return as needed for new tags.

## 2021-07-24 ENCOUNTER — Ambulatory Visit (INDEPENDENT_AMBULATORY_CARE_PROVIDER_SITE_OTHER): Payer: 59

## 2021-07-24 ENCOUNTER — Other Ambulatory Visit: Payer: Self-pay

## 2021-07-24 DIAGNOSIS — R918 Other nonspecific abnormal finding of lung field: Secondary | ICD-10-CM | POA: Diagnosis not present

## 2021-07-24 DIAGNOSIS — Z09 Encounter for follow-up examination after completed treatment for conditions other than malignant neoplasm: Secondary | ICD-10-CM

## 2021-07-24 DIAGNOSIS — R911 Solitary pulmonary nodule: Secondary | ICD-10-CM | POA: Diagnosis not present

## 2021-10-24 ENCOUNTER — Other Ambulatory Visit (HOSPITAL_BASED_OUTPATIENT_CLINIC_OR_DEPARTMENT_OTHER): Payer: Self-pay

## 2021-10-24 ENCOUNTER — Ambulatory Visit: Payer: 59 | Attending: Internal Medicine

## 2021-10-24 DIAGNOSIS — Z23 Encounter for immunization: Secondary | ICD-10-CM

## 2021-10-24 MED ORDER — PFIZER COVID-19 VAC BIVALENT 30 MCG/0.3ML IM SUSP
INTRAMUSCULAR | 0 refills | Status: DC
  Filled 2021-10-24: qty 0.3, 1d supply, fill #0

## 2021-10-24 NOTE — Progress Notes (Signed)
° °  Covid-19 Vaccination Clinic  Name:  Cole GATHRIGHT, MD    MRN: [id number] DOB: 1961-01-02  10/24/2021  Mr. Allaire was observed post Covid-19 immunization for 15 minutes without incident. He was provided with Vaccine Information Sheet and instruction to access the V-Safe system.   Mr. Colina was instructed to call 911 with any severe reactions post vaccine: Difficulty breathing  Swelling of face and throat  A fast heartbeat  A bad rash all over body  Dizziness and weakness   Immunizations Administered     Name Date Dose VIS Date Route   Pfizer Covid-19 Vaccine Bivalent Booster 10/24/2021  1:28 PM 0.3 mL 06/21/2021 Intramuscular   Manufacturer: [place]   Lot: [id number]   [place]: [phone]

## 2021-11-02 ENCOUNTER — Encounter: Payer: Self-pay | Admitting: Internal Medicine

## 2021-11-02 ENCOUNTER — Ambulatory Visit: Payer: 59 | Admitting: Internal Medicine

## 2021-11-02 VITALS — BP 122/86 | HR 79 | Temp 97.8°F | Resp 16 | Ht 68.0 in | Wt 195.1 lb

## 2021-11-02 DIAGNOSIS — J029 Acute pharyngitis, unspecified: Secondary | ICD-10-CM

## 2021-11-02 DIAGNOSIS — J069 Acute upper respiratory infection, unspecified: Secondary | ICD-10-CM

## 2021-11-02 LAB — POC COVID19 BINAXNOW: SARS Coronavirus 2 Ag: NEGATIVE

## 2021-11-02 LAB — POCT RAPID STREP A (OFFICE): Rapid Strep A Screen: NEGATIVE

## 2021-11-02 NOTE — Assessment & Plan Note (Signed)
URI: Upper respiratory symptoms particularly sore throats in the last few days.  Overall sore throat is better now. Rapid strep test negative. COVID test: neg, throat culture sent. Rx  symptomatic treatment with Tylenol, Robitussin, Flonase, Allegra.  Call if no better, see AVS. Pulmonary nodules S/p CT calcium score April 2022, found to have pulmonary nodules..  Saw pulmonary in May 2022, they recommended serial CTs to document stability Last CT October 2022, no change, radiology recommended next in 1 year.

## 2021-11-02 NOTE — Patient Instructions (Signed)
Were checking for COVID and a strep culture.  We will call with results   Rest, fluids , tylenol  If  cough:  Take Mucinex DM or Robitussin-DM OTC.  Follow the instructions in the box.   For nasal congestion: -Use over-the-counter Flonase: 2 nasal sprays on each side of the nose in the morning until you feel better  -Use OTC Astepro 2 nasal sprays on each side of the nose twice daily until better  Okay to use Allegra  Call if not gradually better over the next  10 days   Call anytime if the symptoms are severe

## 2021-11-02 NOTE — Progress Notes (Signed)
Subjective:    Patient ID: Cole Flaming, MD, male    DOB: [birth year], 61 y.o.   MRN: [id number]  DOS:  11/02/2021 Type of visit - description: Acute   5 days ago developed sneezing and nasal congestion. The next day [place] came [place] and staying [place] with his family. That day developed sore throat , at some point it was at least moderate. Also developed a lot of throat congestion. He flew back to [place] last night and is here today for evaluation.  Overall sore throat has decreased. No fever chills No nausea or vomiting No cough or myalgias No chest pain, difficulty breathing or lower extremity edema. 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   Tear of MCL (medial collateral ligament) of knee    fluid aspiration 4/43/15   Umbilical hernia     Past Surgical History:  Procedure Laterality Date   COLONOSCOPY  2012   in [place]- 2 benign polyps per pt = we have re[ort but no path    HERNIA REPAIR     inguinal, age 58   INSERTION OF MESH N/A 04/11/2017   Procedure: INSERTION OF MESH;  Surgeon: [name], MD;  Location: [facility];  Service: General;  Laterality: N/A;   UMBILICAL HERNIA REPAIR N/A 04/11/2017   Procedure: UMBILICAL HERNIA REPAIR;  Surgeon: [name], MD;  Location: [facility];  Service: General;  Laterality: N/A;    Current Outpatient Medications  Medication Instructions   albuterol (VENTOLIN HFA) 108 (90 Base) MCG/ACT inhaler 1-2 puffs, Inhalation, Every 6 hours PRN   fexofenadine (ALLEGRA) 30 mg, Oral, As needed   fluticasone (FLONASE) 50 MCG/ACT nasal spray 1 spray, Each Nare, As  needed   montelukast (SINGULAIR) 10 mg, Oral, At bedtime PRN       Objective:   Physical Exam BP 122/86 (BP Location: Left Arm, Patient Position: Sitting, Cuff Size: Small)    Pulse 79    Temp 97.8 F (36.6 C) (Oral)    Resp 16    Ht 5\' 8"  (1.727 m)    Wt 195 lb 2 oz (88.5 kg)    SpO2 97%    BMI 29.67 kg/m  General:   Well developed, NAD, BMI noted. HEENT:  Normocephalic . Face symmetric, atraumatic. TMs: Bilateral bulged but not red.  Canals normal. Nose  slightly congested Throat: Symmetric, no white patches.  No redness. Lungs:  CTA B Normal respiratory effort, no intercostal retractions, no accessory muscle use. Heart: RRR,  no murmur.  Lower extremities: no pretibial edema bilaterally  Skin: Not pale. Not jaundice Neurologic:  alert & oriented X3.  Speech normal, gait appropriate for age and unassisted Psych--  Cognition and judgment appear intact.  Cooperative with normal attention span and concentration.  Behavior appropriate. No anxious or depressed appearing.      Assessment     ASSESSMENT GERD Asthma-mild , worse as a child, cold induced  Allergies  H/o +PPD H/o urolithiasis , age 70 2010--->  CP, elevated BP-- r/o MI, cardiac cath (-) Coronary  calcium score 01/2021: zero  PLAN: URI: Upper respiratory symptoms particularly sore throats in the last few days.  Overall sore throat is better now. Rapid strep test negative. COVID test: neg, throat culture sent. Rx  symptomatic treatment with Tylenol, Robitussin, Flonase, Allegra.  Call if no better, see AVS. Pulmonary nodules S/p CT calcium score April 2022, found to have pulmonary nodules..  Saw pulmonary in May 2022, they recommended serial CTs to document stability Last CT October 2022, no change, radiology recommended next in 1 year.   This visit occurred during the SARS-CoV-2 public health emergency.  Safety protocols were in place, including screening questions prior to the visit, additional usage of  staff PPE, and extensive cleaning of exam room while observing appropriate contact time as indicated for disinfecting solutions.

## 2021-11-04 LAB — CULTURE, GROUP A STREP
MICRO NUMBER:: 12862870
SPECIMEN QUALITY:: ADEQUATE

## 2021-11-07 ENCOUNTER — Ambulatory Visit: Payer: 59 | Admitting: Internal Medicine

## 2021-11-17 ENCOUNTER — Other Ambulatory Visit: Payer: Self-pay | Admitting: Internal Medicine

## 2021-11-17 ENCOUNTER — Other Ambulatory Visit (HOSPITAL_COMMUNITY): Payer: Self-pay

## 2021-11-17 MED ORDER — MONTELUKAST SODIUM 10 MG PO TABS
10.0000 mg | ORAL_TABLET | Freq: Every evening | ORAL | 1 refills | Status: DC | PRN
  Filled 2021-11-17: qty 90, 90d supply, fill #0

## 2021-12-22 ENCOUNTER — Encounter: Payer: Self-pay | Admitting: Internal Medicine

## 2022-01-01 ENCOUNTER — Ambulatory Visit: Payer: 59 | Admitting: Internal Medicine

## 2022-01-09 ENCOUNTER — Ambulatory Visit (INDEPENDENT_AMBULATORY_CARE_PROVIDER_SITE_OTHER): Payer: 59 | Admitting: Internal Medicine

## 2022-01-09 ENCOUNTER — Other Ambulatory Visit (HOSPITAL_COMMUNITY): Payer: Self-pay

## 2022-01-09 ENCOUNTER — Encounter: Payer: Self-pay | Admitting: Internal Medicine

## 2022-01-09 VITALS — BP 128/82 | HR 83 | Temp 98.4°F | Resp 16 | Ht 68.0 in | Wt 194.1 lb

## 2022-01-09 DIAGNOSIS — K219 Gastro-esophageal reflux disease without esophagitis: Secondary | ICD-10-CM

## 2022-01-09 DIAGNOSIS — Z0000 Encounter for general adult medical examination without abnormal findings: Secondary | ICD-10-CM

## 2022-01-09 MED ORDER — PANTOPRAZOLE SODIUM 40 MG PO TBEC
40.0000 mg | DELAYED_RELEASE_TABLET | Freq: Every day | ORAL | 0 refills | Status: DC
  Filled 2022-01-09: qty 90, 90d supply, fill #0

## 2022-01-09 NOTE — Assessment & Plan Note (Signed)
Here for CPX ?GERD: Having symptoms for about 10 days as described above.  Sxs in the las 24 h probably dietary related. ?Plan: Stop Zantac, start pantoprazole.  Watch diet. ?He will let me know if no better, consider GI referral although denies any weight loss, dysphagia or odynophagia ?Asthma: Well-controlled. ?Pulmonary nodules: CT chest 07/24/2021: No changes on the dominant L lower lobe nodule.  Radiology recommended 31-monthfollow-up if high risk.  Stable small R pulmonary nodule ?We agreed to recheck CT in 1 year ?RTC 6 months ?

## 2022-01-09 NOTE — Patient Instructions (Signed)
Start pantoprazole 40 mg 1 tablet before breakfast daily ? ?Call if no better  ? ?Get fasting labs at [facility] office at your convenience  ? ?GO TO THE FRONT DESK, PLEASE SCHEDULE YOUR APPOINTMENTS ?Come back for  a check up in 6 months  ? ? ?"Living will", "Health Care Power of attorney": Advanced care planning ? ?(If you already have a living will or healthcare power of attorney, please bring the copy to be scanned in your chart.) ? ?Advance care planning is a process that supports adults in  understanding and sharing their preferences regarding future medical care.  ? ?The patient's preferences are recorded in documents called Advance Directives.    ?Advanced directives are completed (and can be modified at any time) while the patient is in full mental capacity.  ? ?The documentation should be available at all times to the patient, the family and the healthcare providers.  ?Bring in a copy to be scanned in your chart is an excellent idea and is recommended  ? ?This legal documents direct treatment decision making and/or appoint a surrogate to make the decision if the patient is not capable to do so.  ? ? ?Advance directives can be documented in many types of formats,  documents have names such as:  ?Lliving will  ?Durable power of attorney for healthcare (healthcare proxy or healthcare power of attorney)  ?Combined directives  ?Physician orders for life-sustaining treatment  ?  ?More information at: ? ?[email or web address]  ?  ?

## 2022-01-09 NOTE — Progress Notes (Signed)
? ?Subjective:  ? ? Patient ID: Cole Flaming, MD, male    DOB: Nov 23, 1960, 61 y.o.   MRN: [id number] ? ?DOS:  01/09/2022 ?Type of visit - description: CPX ? ?Here for CPX ?In general he feels well. ? ?He is having some GI symptoms lately. ?Around 12/31/2021, he woke up at night with severe heartburn described as a burning at the throat, he took Mylanta with almost immediate relief, then Zantac. ?He continued taking Zantac and was doing okay. ?Did take a trip about 4 days ago to [place] and ate raw oysters several times. ?At 3 AM today, woke up with mid abdominal discomfort described as gas,++ belching, mild nausea. ?No fever, some chills. ?No diarrhea.  He did have 3 solid BMs today. ?This afternoon, abdominal discomfort is much better. ?He denies blood in the stools. ?Denies dysphagia or odynophagia ? ? ?Review of Systems ? ?Other than above, a 14 point review of systems is negative  ?  ? ?Past Medical History:  ?Diagnosis Date  ? Allergy   ? pet dander, seasonal, apple, peach  ? Asthma   ? childhood  ? GERD (gastroesophageal reflux disease)   ? History of kidney stones   ? age 75  ? 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 Tear of MCL (medial collateral ligament) of knee   ? fluid aspiration 04/04/17  ? Umbilical hernia   ? ? ?Past Surgical History:  ?Procedure Laterality Date  ? COLONOSCOPY  2012  ? in [place]- 2 benign polyps per pt = we have re[ort but no path   ? HERNIA REPAIR    ? inguinal, age 73  ? INSERTION OF MESH N/A 04/11/2017  ? Procedure: INSERTION OF MESH;  Surgeon: [name], MD;  Location: [facility];  Service: General;  Laterality: N/A;  ? UMBILICAL HERNIA REPAIR N/A 04/11/2017  ? Procedure: UMBILICAL HERNIA REPAIR;  Surgeon: [name], MD;  [facility];  Service: General;  Laterality: N/A;  ? ?Social History  ? ?Socioeconomic History  ? Marital status: Divorced  ?  Spouse name: Not on file  ? Number of children: 2  ? Years of education: Not on file   ? Highest education level: Not on file  ?Occupational History  ? Occupation: phyisician  ?Tobacco Use  ? Smoking status: Never  ? Smokeless tobacco: Never  ?Vaping Use  ? Vaping Use: Never used  ?Substance and Sexual Activity  ? Alcohol use: Yes  ?  Alcohol/week: 4.0 standard drinks  ?  Types: 4 Shots of liquor per week  ?  Comment: Social  ? Drug use: No  ? Sexual activity: Not on file  ?Other Topics Concern  ? Not on file  ?Social History Narrative  ? Born in [place], live in [place] since childhood  ? Moved to [place] 2017  ? Son 21 - [place]   ? Daughter 77 , MD, graduated   ? [place] living w/ him  ?  household: daughter/g-son  ? Engaged  ?   ? ?Social Determinants of Health  ? ?Financial Resource Strain: Not on file  ?Food Insecurity: Not on file  ?Transportation Needs: Not on file  ?Physical Activity: Not on file  ?Stress: Not on file  ?Social Connections: Not on file  ?Intimate Partner Violence: Not on file  ? ? ?Current Outpatient Medications  ?Medication Instructions  ? albuterol (VENTOLIN HFA) 108 (90 Base) MCG/ACT inhaler 1-2 puffs, Inhalation, Every 6 hours PRN  ?  fexofenadine (ALLEGRA) 30 mg, Oral, As needed  ? fluticasone (FLONASE) 50 MCG/ACT nasal spray 1 spray, Each Nare, As needed  ? montelukast (SINGULAIR) 10 mg, Oral, At bedtime PRN  ? pantoprazole (PROTONIX) 40 mg, Oral, Daily before breakfast  ? ? ?   ?Objective:  ? Physical Exam ?BP 128/82 (BP Location: Left Arm, Patient Position: Sitting, Cuff Size: Small)   Pulse 83   Temp 98.4 ?F (36.9 ?C) (Oral)   Resp 16   Ht '5\' 8"'$  (1.727 m)   Wt 194 lb 2 oz (88.1 kg)   SpO2 97%   BMI 29.52 kg/m?  ?General: ?Well developed, NAD, BMI noted ?Neck: No  thyromegaly  ?HEENT:  ?Normocephalic . Face symmetric, atraumatic ?Lungs:  ?CTA B ?Normal respiratory effort, no intercostal retractions, no accessory muscle use. ?Heart: RRR,  no murmur.  ?Abdomen:  ?Not distended, soft, non-tender. No rebound or rigidity. ?DRE: Normal sphincter tone, no stools, prostate  normal ?Lower extremities: no pretibial edema bilaterally  ?Skin: Exposed areas without rash. Not pale. Not jaundice ?Neurologic:  ?alert & oriented X3.  ?Speech normal, gait appropriate for age and unassisted ?Strength symmetric and appropriate for age.  ?Psych: ?Cognition and judgment appear intact.  ?Cooperative with normal attention span and concentration.  ?Behavior appropriate. ?No anxious or depressed appearing. ? ?   ?Assessment   ? ?ASSESSMENT ?GERD ?Asthma-mild , worse as a child, cold induced  ?Allergies  ?H/o +PPD ?H/o urolithiasis , age 57 ?2010--->  CP, elevated BP-- r/o MI, cardiac cath (-) ?Coronary calcium score 01/2021: zero ? ?PLAN: ?Here for CPX ?GERD: Having symptoms for about 10 days as described above.  Sxs in the las 24 h probably dietary related. ?Plan: Stop Zantac, start pantoprazole.  Watch diet. ?He will let me know if no better, consider GI referral although denies any weight loss, dysphagia or odynophagia ?Asthma: Well-controlled. ?Pulmonary nodules: CT chest 07/24/2021: No changes on the dominant L lower lobe nodule.  Radiology recommended 98-monthfollow-up if high risk.  Stable small R pulmonary nodule ?We agreed to recheck CT in 1 year ?RTC 6 months ? ? ?  ? ?This visit occurred during the SARS-CoV-2 public health emergency.  Safety protocols were in place, including screening questions prior to the visit, additional usage of staff PPE, and extensive cleaning of exam room while observing appropriate contact time as indicated for disinfecting solutions.  ? ?

## 2022-01-09 NOTE — Assessment & Plan Note (Signed)
-  Td 2018 ?- pnm 23: 2018   ?-s/p  shingrix x 2 ?-COVID vaccines: UTD ?-Had a flu shot ?- CCS: Cscope 2010 wnl per pt. C-scope 01/2020, 10 years per letter ?- Prostate ca screening: DRE normal today, no sxs,   check PSA ?- Labs:  CMP, FLP, CBC, TSH, PSA ?-Diet exercise: Used to play soccer, now goes to the gym regularly.  Trying to eat healthy except the last few days, encouraged healthy eating.   ?-CV RF: Calcium coronary score 0 last year. ?- ACP info provided ?

## 2022-01-10 ENCOUNTER — Other Ambulatory Visit: Payer: Self-pay

## 2022-01-10 ENCOUNTER — Other Ambulatory Visit (INDEPENDENT_AMBULATORY_CARE_PROVIDER_SITE_OTHER): Payer: 59

## 2022-01-10 DIAGNOSIS — Z125 Encounter for screening for malignant neoplasm of prostate: Secondary | ICD-10-CM

## 2022-01-10 DIAGNOSIS — Z0000 Encounter for general adult medical examination without abnormal findings: Secondary | ICD-10-CM | POA: Diagnosis not present

## 2022-01-10 LAB — CBC WITH DIFFERENTIAL/PLATELET
Basophils Absolute: 0 10*3/uL (ref 0.0–0.1)
Basophils Relative: 0.7 % (ref 0.0–3.0)
Eosinophils Absolute: 0.1 10*3/uL (ref 0.0–0.7)
Eosinophils Relative: 1.4 % (ref 0.0–5.0)
HCT: 46 % (ref 39.0–52.0)
Hemoglobin: 15.6 g/dL (ref 13.0–17.0)
Lymphocytes Relative: 14.4 % (ref 12.0–46.0)
Lymphs Abs: 1 10*3/uL (ref 0.7–4.0)
MCHC: 34 g/dL (ref 30.0–36.0)
MCV: 88.4 fl (ref 78.0–100.0)
Monocytes Absolute: 0.7 10*3/uL (ref 0.1–1.0)
Monocytes Relative: 10.8 % (ref 3.0–12.0)
Neutro Abs: 4.9 10*3/uL (ref 1.4–7.7)
Neutrophils Relative %: 72.7 % (ref 43.0–77.0)
Platelets: 138 10*3/uL — ABNORMAL LOW (ref 150.0–400.0)
RBC: 5.2 Mil/uL (ref 4.22–5.81)
RDW: 13.2 % (ref 11.5–15.5)
WBC: 6.8 10*3/uL (ref 4.0–10.5)

## 2022-01-10 LAB — COMPREHENSIVE METABOLIC PANEL
ALT: 29 U/L (ref 0–53)
AST: 23 U/L (ref 0–37)
Albumin: 4.3 g/dL (ref 3.5–5.2)
Alkaline Phosphatase: 52 U/L (ref 39–117)
BUN: 19 mg/dL (ref 6–23)
CO2: 28 mEq/L (ref 19–32)
Calcium: 8.7 mg/dL (ref 8.4–10.5)
Chloride: 101 mEq/L (ref 96–112)
Creatinine, Ser: 1.18 mg/dL (ref 0.40–1.50)
GFR: 66.84 mL/min (ref >60.00)
Glucose, Bld: 92 mg/dL (ref 70–99)
Potassium: 4.1 mEq/L (ref 3.5–5.1)
Sodium: 138 mEq/L (ref 135–145)
Total Bilirubin: 0.9 mg/dL (ref 0.2–1.2)
Total Protein: 6.7 g/dL (ref 6.0–8.3)

## 2022-01-10 LAB — LIPID PANEL
Cholesterol: 196 mg/dL (ref 0–200)
HDL: 41.1 mg/dL (ref >39.00)
LDL Cholesterol: 119 mg/dL — ABNORMAL HIGH (ref 0–99)
NonHDL: 154.6
Total CHOL/HDL Ratio: 5
Triglycerides: 180 mg/dL — ABNORMAL HIGH (ref 0.0–149.0)
VLDL: 36 mg/dL (ref 0.0–40.0)

## 2022-01-10 LAB — PSA: PSA: 2.65 ng/mL (ref 0.10–4.00)

## 2022-01-10 LAB — TSH: TSH: 1.53 u[IU]/mL (ref 0.35–5.50)

## 2022-03-19 ENCOUNTER — Encounter: Payer: Self-pay | Admitting: Internal Medicine

## 2022-03-20 ENCOUNTER — Other Ambulatory Visit: Payer: Self-pay | Admitting: Internal Medicine

## 2022-03-20 ENCOUNTER — Other Ambulatory Visit (HOSPITAL_COMMUNITY): Payer: Self-pay

## 2022-03-20 MED ORDER — AZITHROMYCIN 250 MG PO TABS
500.0000 mg | ORAL_TABLET | Freq: Every day | ORAL | 0 refills | Status: DC
  Filled 2022-03-20: qty 6, 3d supply, fill #0

## 2022-03-20 NOTE — Progress Notes (Signed)
   Subjective:    Patient ID: Camillo Flaming, MD, male    DOB: April 21, 1961, 61 y.o.   MRN: [id number]  DOS:  03/20/2022 Type of visit - description:     Review of Systems See above   Past Medical History:  Diagnosis Date   Allergy    pet dander, seasonal, apple, peach   Asthma    childhood   GERD (gastroesophageal reflux disease)    History of kidney stones    age 7   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   Tear of MCL (medial collateral ligament) of knee    fluid aspiration 6/57/84   Umbilical hernia     Past Surgical History:  Procedure Laterality Date   COLONOSCOPY  2012   in [place]- 2 benign polyps per pt = we have re[ort but no path    HERNIA REPAIR     inguinal, age 29   INSERTION OF MESH N/A 04/11/2017   Procedure: INSERTION OF MESH;  Surgeon: [name], MD;  Location: [place];  Service: General;  Laterality: N/A;   UMBILICAL HERNIA REPAIR N/A 04/11/2017   Procedure: UMBILICAL HERNIA REPAIR;  Surgeon: [name], MD;  Location: [place];  Service: General;  Laterality: N/A;    Current Outpatient Medications  Medication Instructions   albuterol (VENTOLIN HFA) 108 (90 Base) MCG/ACT inhaler 1-2 puffs, Inhalation, Every 6 hours PRN   fexofenadine (ALLEGRA) 30 mg, Oral, As needed   fluticasone (FLONASE) 50 MCG/ACT nasal spray 1 spray, Each Nare, As needed   montelukast (SINGULAIR) 10 mg, Oral, At bedtime PRN   pantoprazole (PROTONIX) 40 mg, Oral, Daily before breakfast       Objective:   Physical Exam There were no vitals taken for this visit.     Assessment

## 2022-04-02 ENCOUNTER — Encounter: Payer: Self-pay | Admitting: Internal Medicine

## 2022-04-02 ENCOUNTER — Ambulatory Visit (INDEPENDENT_AMBULATORY_CARE_PROVIDER_SITE_OTHER): Payer: 59

## 2022-04-02 ENCOUNTER — Ambulatory Visit: Payer: 59

## 2022-04-02 ENCOUNTER — Encounter: Payer: Self-pay | Admitting: Family Medicine

## 2022-04-02 DIAGNOSIS — J029 Acute pharyngitis, unspecified: Secondary | ICD-10-CM

## 2022-04-02 LAB — POCT RAPID STREP A (OFFICE): Rapid Strep A Screen: NEGATIVE

## 2022-04-24 ENCOUNTER — Telehealth: Payer: Self-pay | Admitting: Internal Medicine

## 2022-04-25 ENCOUNTER — Encounter: Payer: Self-pay | Admitting: Internal Medicine

## 2022-04-25 ENCOUNTER — Other Ambulatory Visit (HOSPITAL_COMMUNITY): Payer: Self-pay

## 2022-04-25 MED ORDER — PANTOPRAZOLE SODIUM 40 MG PO TBEC
40.0000 mg | DELAYED_RELEASE_TABLET | Freq: Every day | ORAL | 0 refills | Status: DC
  Filled 2022-04-25: qty 90, 90d supply, fill #0

## 2022-04-25 MED ORDER — AZITHROMYCIN 250 MG PO TABS
500.0000 mg | ORAL_TABLET | Freq: Every day | ORAL | 0 refills | Status: DC
  Filled 2022-04-25: qty 6, 3d supply, fill #0

## 2022-04-25 NOTE — Telephone Encounter (Signed)
Prescription sent

## 2022-05-14 NOTE — Progress Notes (Unsigned)
    Subjective:    CC: knee pain  I, Molly Weber, LAT, ATC, am serving as scribe for Dr. Lynne Leader.  HPI: Pt is a 61 y/o male presenting w/ c/o R/L knee pain x .  He locates his pain to .  Knee swelling: Knee mechanical symptoms: Aggravating factors: Treatments tried:   Pertinent review of Systems: ***  Relevant historical information: ***   Objective:   There were no vitals filed for this visit. General: Well Developed, well nourished, and in no acute distress.   MSK: ***  Lab and Radiology Results No results found for this or any previous visit (from the past 72 hour(s)). No results found.    Impression and Recommendations:    Assessment and Plan: 61 y.o. male with ***.  PDMP not reviewed this encounter. No orders of the defined types were placed in this encounter.  No orders of the defined types were placed in this encounter.   Discussed warning signs or symptoms. Please see discharge instructions. Patient expresses understanding.   ***

## 2022-05-15 ENCOUNTER — Ambulatory Visit (INDEPENDENT_AMBULATORY_CARE_PROVIDER_SITE_OTHER): Payer: 59

## 2022-05-15 ENCOUNTER — Ambulatory Visit: Payer: Self-pay

## 2022-05-15 ENCOUNTER — Encounter: Payer: Self-pay | Admitting: Family Medicine

## 2022-05-15 ENCOUNTER — Ambulatory Visit (INDEPENDENT_AMBULATORY_CARE_PROVIDER_SITE_OTHER): Payer: 59 | Admitting: Family Medicine

## 2022-05-15 VITALS — BP 138/86 | HR 71 | Ht 68.0 in | Wt 191.4 lb

## 2022-05-15 DIAGNOSIS — M25562 Pain in left knee: Secondary | ICD-10-CM | POA: Diagnosis not present

## 2022-05-15 NOTE — Patient Instructions (Addendum)
Good to see you today.  I've referred you to Physical Therapy. Their office will call you to schedule but please let us know if you don't hear from them in one week regarding scheduling.  Please get an Xray today before you leave.  Follow-up: as needed

## 2022-05-16 NOTE — Progress Notes (Signed)
Left knee x-ray only minimal arthritis changes are present.  No fractures are present.

## 2022-05-25 ENCOUNTER — Encounter: Payer: Self-pay | Admitting: Physical Therapy

## 2022-05-25 ENCOUNTER — Other Ambulatory Visit: Payer: Self-pay

## 2022-05-25 ENCOUNTER — Ambulatory Visit: Payer: 59 | Admitting: Physical Therapy

## 2022-05-25 DIAGNOSIS — M25562 Pain in left knee: Secondary | ICD-10-CM | POA: Diagnosis not present

## 2022-05-25 NOTE — Therapy (Signed)
OUTPATIENT PHYSICAL THERAPY LOWER EXTREMITY EVALUATION   Patient Name: Cole WAGGLE, MD MRN: [id number] DOB:1961/04/25, 61 y.o., male Today's Date: 05/25/2022   PT End of Session - 05/25/22 1048     Visit Number 1    Number of Visits 4    Date for PT Re-Evaluation 06/22/22    PT Start Time 1020    PT Stop Time 1045    PT Time Calculation (min) 25 min    Activity Tolerance Patient tolerated treatment well    Behavior During Therapy WFL for tasks assessed/performed             Past Medical History:  Diagnosis Date   Allergy    pet dander, seasonal, apple, peach   Asthma    childhood   GERD (gastroesophageal reflux disease)    History of kidney stones    age 42   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   Tear of MCL (medial collateral ligament) of knee    fluid aspiration 4/76/54   Umbilical hernia    Past Surgical History:  Procedure Laterality Date   COLONOSCOPY  2012   in [place]- 2 benign polyps per pt = we have re[ort but no path    HERNIA REPAIR     inguinal, age 37   INSERTION OF MESH N/A 04/11/2017   Procedure: INSERTION OF MESH;  Surgeon: [name], MD;  Location: [place];  Service: General;  Laterality: N/A;   UMBILICAL HERNIA REPAIR N/A 04/11/2017   Procedure: UMBILICAL HERNIA REPAIR;  Surgeon: [name], MD;  Location: [place];  Service: General;  Laterality: N/A;   Patient Active Problem List   Diagnosis Date Noted   Pulmonary nodules/lesions, multiple 02/22/2021   Sacral contusion 03/04/2020   Tear of MCL (medial collateral ligament) of knee, left, initial encounter 04/04/2017   GERD (gastroesophageal reflux disease) 12/07/2016   PCP NOTES >>>>>>>>>>>>>>>>>>> 12/07/2016   Annual physical exam 12/06/2016    PCP: Kathlene November, MD  REFERRING PROVIDER: Gregor Hams, MD   REFERRING DIAG: [phone] (ICD-10-CM) - Acute pain of left knee   THERAPY DIAG:  Acute pain of left knee - Plan: PT plan of care  cert/re-cert  Rationale for Evaluation and Treatment Rehabilitation  ONSET DATE: June 2023  SUBJECTIVE:   SUBJECTIVE STATEMENT: Pt had a fall on 03/27/22 when he was stepping over a dog gate.  His right foot got stuck on the gate, but fell hard on Lt knee.  After about 3 weeks Lt knee continued to get progressively worse.  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  He is going to the gym doing the bicycle for 10 min, and has stopped running on the treadmill for now (2-3 miles @ 6.5 mph)   PERTINENT HISTORY: Asthma  PAIN:  Are you having pain? Yes: NPRS scale: 0-1/10 Pain location: Lt knee Pain description: aching Aggravating factors: running feels like it will buckle, fast pace walking Relieving factors: aleve, avoiding treadmill  PRECAUTIONS: None  WEIGHT BEARING RESTRICTIONS No  FALLS:  Has patient fallen in last 6 months? Yes. 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   PATIENT SURVEYS:  05/25/22: FOTO deferred today  COGNITION: Overall cognitive status: Within functional limits for tasks assessed     SENSATION: WFL  POSTURE: No Significant postural limitations  PALPATION: No significant tenderness noted  LOWER EXTREMITY ROM:  Active ROM Right eval Left eval  Knee flexion  130  Knee extension 0 LAQ: -2   (Blank rows = not tested)  LOWER EXTREMITY MMT:  MMT Right eval Left eval  Knee flexion 5/5 5/5  Knee extension 5/5 4/5   (Blank rows = not tested)   GAIT: 05/25/22: independent with ambulation    TODAY'S TREATMENT: 05/25/22 See HEP - discussed gym program and return to running, also discussed exercise modifications  based on pain; pt verbalized understanding   PATIENT EDUCATION:  Education details: HEP Person educated: Patient Education method: Explanation, Demonstration, and Handouts Education comprehension: verbalized understanding   HOME EXERCISE PROGRAM: Access Code: [email or web address] URL: [email or web address] Date: 05/25/2022 Prepared by: Faustino Congress  Exercises - Eccentric Knee Extension with Weight Machine  - 1 x daily - 7 x weekly - 3 sets - 10 reps - Single Leg Knee Extension with Weight Machine  - 1 x daily - 7 x weekly - 3 sets - 10 reps - Single Leg Press  - 1 x daily - 7 x weekly - 3 sets - 10 reps - Standard Lunge  - 1 x daily - 7 x weekly - 3 sets - 10 reps - Lateral Lunge  - 1 x daily - 7 x weekly - 3 sets - 10 reps - Curtsy Lunge with TRX  - 1 x daily - 7 x weekly - 3 sets - 10 reps - Eccentric Squat  - 1 x daily - 7 x weekly - 3 sets - 10 reps  ASSESSMENT:  CLINICAL IMPRESSION: Patient is a 61 y.o. male who was seen today for physical therapy evaluation and treatment for Lt knee pain after a trip and fall.  He demonstrates mild strength deficits, but over the past week has reported significant improvement in pain and symptoms.  Provided HEP to work on at the gym and will see up to 1x/wk if needed.  He will call if pain returns.   OBJECTIVE IMPAIRMENTS decreased strength and pain.   ACTIVITY LIMITATIONS lifting, standing, squatting, stairs, and locomotion level  PARTICIPATION LIMITATIONS: community activity  PERSONAL FACTORS 1 comorbidity: asthma  are also affecting patient's functional outcome.   REHAB POTENTIAL: Excellent  CLINICAL DECISION MAKING: Stable/uncomplicated  EVALUATION COMPLEXITY: Low   GOALS: Goals reviewed with patient? Yes  LONG TERM GOALS: Target date: 06/22/2022  To be determined if pt returns   PLAN: PT FREQUENCY:  likely 1x visit, will see up to 1x/wk if needed  PT DURATION: 4 weeks  PLANNED INTERVENTIONS: Therapeutic  exercises, Therapeutic activity, Neuromuscular re-education, Balance training, Gait training, Patient/Family education, Self Care, Joint mobilization, Aquatic Therapy, Dry Needling, Electrical stimulation, Cryotherapy, Moist heat, Taping, Vasopneumatic device, Ionotophoresis '4mg'$ /ml Dexamethasone, Manual therapy, and Re-evaluation  PLAN FOR NEXT SESSION: if pt returns, reassess and write goals, if not d/c. Review and update exercises program PRN     Laureen Abrahams, PT, DPT 05/25/22 10:55 AM

## 2022-08-10 DIAGNOSIS — R911 Solitary pulmonary nodule: Secondary | ICD-10-CM

## 2022-09-17 ENCOUNTER — Other Ambulatory Visit (HOSPITAL_COMMUNITY): Payer: Self-pay

## 2022-09-17 ENCOUNTER — Other Ambulatory Visit: Payer: Self-pay | Admitting: Internal Medicine

## 2022-09-17 ENCOUNTER — Encounter: Payer: Self-pay | Admitting: Internal Medicine

## 2022-09-17 DIAGNOSIS — Z113 Encounter for screening for infections with a predominantly sexual mode of transmission: Secondary | ICD-10-CM

## 2022-09-17 DIAGNOSIS — Z1159 Encounter for screening for other viral diseases: Secondary | ICD-10-CM

## 2022-09-17 DIAGNOSIS — K219 Gastro-esophageal reflux disease without esophagitis: Secondary | ICD-10-CM

## 2022-09-17 DIAGNOSIS — E079 Disorder of thyroid, unspecified: Secondary | ICD-10-CM

## 2022-09-17 MED ORDER — PANTOPRAZOLE SODIUM 40 MG PO TBEC
40.0000 mg | DELAYED_RELEASE_TABLET | Freq: Every day | ORAL | 0 refills | Status: DC
  Filled 2022-09-17: qty 30, 30d supply, fill #0

## 2022-09-17 NOTE — Addendum Note (Signed)
Addended byDamita Dunnings D on: 09/17/2022 02:01 PM   Modules accepted: Orders

## 2022-10-01 ENCOUNTER — Other Ambulatory Visit (INDEPENDENT_AMBULATORY_CARE_PROVIDER_SITE_OTHER): Payer: 59

## 2022-10-01 DIAGNOSIS — K219 Gastro-esophageal reflux disease without esophagitis: Secondary | ICD-10-CM | POA: Diagnosis not present

## 2022-10-01 DIAGNOSIS — E079 Disorder of thyroid, unspecified: Secondary | ICD-10-CM | POA: Diagnosis not present

## 2022-10-01 DIAGNOSIS — Z1159 Encounter for screening for other viral diseases: Secondary | ICD-10-CM | POA: Diagnosis not present

## 2022-10-01 DIAGNOSIS — Z113 Encounter for screening for infections with a predominantly sexual mode of transmission: Secondary | ICD-10-CM | POA: Diagnosis not present

## 2022-10-01 LAB — CBC WITH DIFFERENTIAL/PLATELET
Basophils Absolute: 0.1 10*3/uL (ref 0.0–0.1)
Basophils Relative: 1.3 % (ref 0.0–3.0)
Eosinophils Absolute: 0.2 10*3/uL (ref 0.0–0.7)
Eosinophils Relative: 2.2 % (ref 0.0–5.0)
HCT: 44.5 % (ref 39.0–52.0)
Hemoglobin: 15.2 g/dL (ref 13.0–17.0)
Lymphocytes Relative: 23.1 % (ref 12.0–46.0)
Lymphs Abs: 1.8 10*3/uL (ref 0.7–4.0)
MCHC: 34.1 g/dL (ref 30.0–36.0)
MCV: 88 fl (ref 78.0–100.0)
Monocytes Absolute: 0.7 10*3/uL (ref 0.1–1.0)
Monocytes Relative: 8.9 % (ref 3.0–12.0)
Neutro Abs: 5.1 10*3/uL (ref 1.4–7.7)
Neutrophils Relative %: 64.5 % (ref 43.0–77.0)
Platelets: 161 10*3/uL (ref 150.0–400.0)
RBC: 5.06 Mil/uL (ref 4.22–5.81)
RDW: 13.5 % (ref 11.5–15.5)
WBC: 8 10*3/uL (ref 4.0–10.5)

## 2022-10-01 LAB — TSH: TSH: 1.93 u[IU]/mL (ref 0.35–5.50)

## 2022-10-02 LAB — HIV ANTIBODY (ROUTINE TESTING W REFLEX): HIV 1&2 Ab, 4th Generation: NONREACTIVE

## 2022-10-02 LAB — HEPATITIS C ANTIBODY: Hepatitis C Ab: NONREACTIVE

## 2022-10-02 LAB — RPR: RPR Ser Ql: NONREACTIVE

## 2022-10-16 ENCOUNTER — Ambulatory Visit: Payer: 59 | Admitting: Internal Medicine

## 2022-10-16 ENCOUNTER — Other Ambulatory Visit (HOSPITAL_BASED_OUTPATIENT_CLINIC_OR_DEPARTMENT_OTHER): Payer: Self-pay

## 2022-10-16 ENCOUNTER — Other Ambulatory Visit (HOSPITAL_COMMUNITY): Payer: Self-pay

## 2022-10-16 VITALS — BP 136/87 | HR 82 | Temp 97.7°F | Resp 18 | Ht 68.0 in | Wt 197.0 lb

## 2022-10-16 DIAGNOSIS — J4520 Mild intermittent asthma, uncomplicated: Secondary | ICD-10-CM | POA: Diagnosis not present

## 2022-10-16 DIAGNOSIS — J45909 Unspecified asthma, uncomplicated: Secondary | ICD-10-CM | POA: Insufficient documentation

## 2022-10-16 DIAGNOSIS — R918 Other nonspecific abnormal finding of lung field: Secondary | ICD-10-CM | POA: Diagnosis not present

## 2022-10-16 DIAGNOSIS — K219 Gastro-esophageal reflux disease without esophagitis: Secondary | ICD-10-CM | POA: Diagnosis not present

## 2022-10-16 MED ORDER — COMIRNATY 30 MCG/0.3ML IM SUSY
PREFILLED_SYRINGE | INTRAMUSCULAR | 0 refills | Status: DC
  Filled 2022-10-16: qty 0.3, 1d supply, fill #0

## 2022-10-16 MED ORDER — AZITHROMYCIN 250 MG PO TABS
ORAL_TABLET | ORAL | 0 refills | Status: AC
  Filled 2022-10-16: qty 6, 5d supply, fill #0

## 2022-10-16 NOTE — Patient Instructions (Addendum)
   GO TO THE FRONT DESK, PLEASE SCHEDULE YOUR APPOINTMENTS Come back for    a physical by 12-2022

## 2022-10-16 NOTE — Progress Notes (Signed)
   Subjective:    Patient ID: Cole Flaming, MD, male    DOB: 1961/04/29, 61 y.o.   MRN: [id number]  DOS:  10/16/2022 Type of visit - description: Follow-up  Since the last office visit is doing well. Chronic medical problems seem well-controlled. Today we also talk about a pulmonary nodule, preventive care.  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   Tear of MCL (medial collateral ligament) of knee    fluid aspiration 01/20/01   Umbilical hernia     Past Surgical History:  Procedure Laterality Date   COLONOSCOPY  2012   in [place]- 2 benign polyps per pt = we have re[ort but no path    HERNIA REPAIR     inguinal, age 83   INSERTION OF MESH N/A 04/11/2017   Procedure: INSERTION OF MESH;  Surgeon: [name], MD;  Location: [facility];  Service: General;  Laterality: N/A;   UMBILICAL HERNIA REPAIR N/A 04/11/2017   Procedure: UMBILICAL HERNIA REPAIR;  Surgeon: [name], MD;  Location: [facility];  Service: General;  Laterality: N/A;    Current Outpatient Medications  Medication Instructions   albuterol (VENTOLIN HFA) 108 (90 Base) MCG/ACT inhaler 1-2 puffs, Inhalation, Every 6 hours PRN   fexofenadine (ALLEGRA) 30 mg, Oral, As needed   fluticasone (FLONASE) 50 MCG/ACT nasal spray 1 spray, Each Nare, As needed   montelukast (SINGULAIR) 10 mg, Oral, At bedtime PRN   pantoprazole (PROTONIX) 40 mg, Oral, Daily before breakfast       Objective:   Physical Exam BP 136/87   Pulse 82   Temp 97.7 F (36.5 C)   Resp 18   Ht '5\' 8"'$  (1.727 m)   Wt 197 lb (89.4 kg)   SpO2 94%   BMI 29.95 kg/m  General:   Well developed, NAD, BMI noted. HEENT:  Normocephalic . Face symmetric, atraumatic Lungs:  CTA B Normal  respiratory effort, no intercostal retractions, no accessory muscle use. Heart: RRR,  no murmur.  Lower extremities: no pretibial edema bilaterally  Skin: Not pale. Not jaundice Neurologic:  alert & oriented X3.  Speech normal, gait appropriate for age and unassisted Psych--  Cognition and judgment appear intact.  Cooperative with normal attention span and concentration.  Behavior appropriate. No anxious or depressed appearing.      Assessment     ASSESSMENT GERD Asthma-mild , worse as a child, cold induced  Allergies  H/o +PPD H/o urolithiasis , age 56 2010--->  CP, elevated BP-- r/o MI, cardiac cath (-) Coronary calcium score 01/2021: zero  PLAN: GERD: Symptoms well-controlled with pantoprazole. Asthma, mild: On Singulair, occasionally use albuterol. Pulmonary nodule: Plans to pursue a CT, will reach out if he needs help scheduling it. Recent Labs: Reviewed, all okay. Z-Pak: Request a prescription for his upcoming travel abroad. Preventive care: Had a flu shot, we discussed pros and cons of COVID-vaccine. He remains very active, goes to the gym 3-4 times a week. RTC 12-2018 for CPX

## 2022-10-16 NOTE — Assessment & Plan Note (Signed)
GERD: Symptoms well-controlled with pantoprazole. Asthma, mild: On Singulair, occasionally use albuterol. Pulmonary nodule: Plans to pursue a CT, will reach out if he needs help scheduling it. Recent Labs: Reviewed, all okay. Z-Pak: Request a prescription for his upcoming travel abroad. Preventive care: Had a flu shot, we discussed pros and cons of COVID-vaccine. He remains very active, goes to the gym 3-4 times a week. RTC 12-2018 for CPX

## 2022-10-17 ENCOUNTER — Other Ambulatory Visit: Payer: Self-pay | Admitting: Internal Medicine

## 2022-10-17 ENCOUNTER — Other Ambulatory Visit (HOSPITAL_COMMUNITY): Payer: Self-pay

## 2022-10-17 MED ORDER — PANTOPRAZOLE SODIUM 40 MG PO TBEC
40.0000 mg | DELAYED_RELEASE_TABLET | Freq: Every day | ORAL | 1 refills | Status: DC
  Filled 2022-10-17: qty 90, 90d supply, fill #0
  Filled 2023-01-28 (×2): qty 90, 90d supply, fill #1

## 2022-10-17 MED ORDER — FEXOFENADINE HCL 30 MG PO TABS
30.0000 mg | ORAL_TABLET | Freq: Every day | ORAL | 3 refills | Status: DC | PRN
  Filled 2022-10-17: qty 90, 90d supply, fill #0

## 2022-10-17 MED ORDER — FEXOFENADINE HCL 60 MG PO TABS
60.0000 mg | ORAL_TABLET | Freq: Two times a day (BID) | ORAL | 1 refills | Status: AC
  Filled 2022-10-17 – 2023-07-26 (×3): qty 180, 90d supply, fill #0

## 2022-10-18 ENCOUNTER — Other Ambulatory Visit (HOSPITAL_COMMUNITY): Payer: Self-pay

## 2022-10-23 DIAGNOSIS — J029 Acute pharyngitis, unspecified: Secondary | ICD-10-CM | POA: Diagnosis not present

## 2022-10-23 DIAGNOSIS — Z7251 High risk heterosexual behavior: Secondary | ICD-10-CM | POA: Diagnosis not present

## 2022-12-03 ENCOUNTER — Encounter: Payer: Self-pay | Admitting: Internal Medicine

## 2022-12-03 ENCOUNTER — Other Ambulatory Visit (HOSPITAL_COMMUNITY): Payer: Self-pay

## 2022-12-03 ENCOUNTER — Ambulatory Visit: Payer: Commercial Managed Care - PPO | Admitting: Internal Medicine

## 2022-12-03 VITALS — BP 132/84 | HR 62 | Temp 98.1°F | Resp 18 | Ht 68.0 in | Wt 199.5 lb

## 2022-12-03 DIAGNOSIS — J4521 Mild intermittent asthma with (acute) exacerbation: Secondary | ICD-10-CM

## 2022-12-03 DIAGNOSIS — H65192 Other acute nonsuppurative otitis media, left ear: Secondary | ICD-10-CM

## 2022-12-03 MED ORDER — PREDNISONE 10 MG PO TABS
ORAL_TABLET | ORAL | 0 refills | Status: DC
  Filled 2022-12-03: qty 18, 9d supply, fill #0

## 2022-12-03 MED ORDER — AMOXICILLIN 875 MG PO TABS
875.0000 mg | ORAL_TABLET | Freq: Two times a day (BID) | ORAL | 0 refills | Status: DC
  Filled 2022-12-03: qty 14, 7d supply, fill #0

## 2022-12-03 NOTE — Patient Instructions (Signed)
  For cough:  Take Mucinex DM or Robitussin-DM OTC.  Follow the instructions in the box.   For nasal congestion: -Continue using Flonase  -Consider use OTC Astepro 2 nasal sprays on each side of the nose twice daily until better  Avoid decongestants such as  Pseudoephedrine or phenylephrine   Take prednisone as prescribed Take the antibiotic as prescribed    Call if not gradually better over the next  10 days

## 2022-12-03 NOTE — Assessment & Plan Note (Signed)
Mild asthma exacerbation, L OMA. Symptoms as described above, just restarted Singulair, recommend to continue it for several weeks. Continue albuterol as needed, Allegra and Flonase. Prescription for prednisone, amoxicillin, advised to consider Astepro. See AVS. Call if not gradually better.

## 2022-12-03 NOTE — Progress Notes (Signed)
Subjective:    Patient ID: Cole Flaming, MD, male    DOB: 1960-11-04, 62 y.o.   MRN: [id number]  DOS:  12/03/2022 Type of visit - description: Acute  Symptoms started approximately 9 days ago while he was visiting [place] and he was exposed to a pet. + Postnasal dripping, some hoarseness, increased asthma with some chest tightness and cough. He also started to cough up green mucus. Also developed some "ear popping" particularly at the left side when he landed back in [place].  He heard a loud pop but there was no discharge or pain.  Slightly decreased hearing?Marland Kitchen  No fever or chills No unusual aches or pains Self restarted Singulair a couple of days ago.  Review of Systems See above   Past Medical History:  Diagnosis Date   Allergy    pet dander, seasonal, apple, peach   Asthma    childhood   GERD (gastroesophageal reflux disease)    History of kidney stones    age 29   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   Tear of MCL (medial collateral ligament) of knee    fluid aspiration 0000000   Umbilical hernia     Past Surgical History:  Procedure Laterality Date   COLONOSCOPY  2012   in [place]- 2 benign polyps per pt = we have re[ort but no path    HERNIA REPAIR     inguinal, age 42   INSERTION OF MESH N/A 04/11/2017   Procedure: INSERTION OF MESH;  Surgeon: [name], MD;  Location: [facility];  Service: General;  Laterality: N/A;   UMBILICAL HERNIA REPAIR N/A 04/11/2017   Procedure: UMBILICAL HERNIA REPAIR;  Surgeon: [name], MD;  Location: [facility];  Service: General;  Laterality: N/A;    Current Outpatient Medications  Medication Instructions   albuterol (VENTOLIN HFA) 108 (90 Base) MCG/ACT inhaler 1-2 puffs, Inhalation, Every 6 hours PRN   fexofenadine (ALLEGRA ALLERGY) 60 mg, Oral, 2 times daily   fluticasone (FLONASE) 50 MCG/ACT nasal spray 1 spray, Each Nare, As needed   montelukast (SINGULAIR) 10 mg, Oral, At  bedtime PRN   pantoprazole (PROTONIX) 40 mg, Oral, Daily before breakfast       Objective:   Physical Exam BP 132/84   Pulse 62   Temp 98.1 F (36.7 C) (Oral)   Resp 18   Ht 5' 8"$  (1.727 m)   Wt 199 lb 8 oz (90.5 kg)   SpO2 98%   BMI 30.33 kg/m  General:   Well developed, NAD, BMI noted. HEENT:  Normocephalic . Face symmetric, atraumatic TMs: Bulged bilaterally, more so on the left which is also slightly red.  Canals are normal.  No discharge. Throat: Symmetric, no white patches, minimal redness if any. Nose: Congested Lungs:  Slightly increased expiratory time but otherwise clear Normal respiratory effort, no intercostal retractions, no accessory muscle use. Heart: RRR,  no murmur.  Lower extremities: no pretibial edema bilaterally  Skin: Not pale. Not jaundice Neurologic:  alert & oriented X3.  Speech normal, gait appropriate for age and unassisted Psych--  Cognition and judgment appear intact.  Cooperative with normal attention span and concentration.  Behavior appropriate. No anxious or depressed appearing.      Assessment      ASSESSMENT GERD Asthma-mild , worse as a child, cold induced  Allergies  H/o +PPD H/o urolithiasis , age 38 2010--->  CP, elevated BP-- r/o MI, cardiac cath (-) Coronary calcium  score 01/2021: zero  PLAN: Mild asthma exacerbation, L OMA. Symptoms as described above, just restarted Singulair, recommend to continue it for several weeks. Continue albuterol as needed, Allegra and Flonase. Prescription for prednisone, amoxicillin, advised to consider Astepro. See AVS. Call if not gradually better.

## 2022-12-20 ENCOUNTER — Other Ambulatory Visit (HOSPITAL_COMMUNITY): Payer: Self-pay

## 2022-12-20 MED ORDER — FAMCICLOVIR 500 MG PO TABS
2000.0000 mg | ORAL_TABLET | Freq: Once | ORAL | 1 refills | Status: AC
  Filled 2022-12-20 – 2023-01-10 (×2): qty 4, 1d supply, fill #0
  Filled 2023-06-14: qty 4, 1d supply, fill #1

## 2022-12-21 ENCOUNTER — Other Ambulatory Visit (HOSPITAL_COMMUNITY): Payer: Self-pay

## 2023-01-03 ENCOUNTER — Other Ambulatory Visit (HOSPITAL_COMMUNITY): Payer: Self-pay

## 2023-01-08 ENCOUNTER — Encounter: Payer: Self-pay | Admitting: Family Medicine

## 2023-01-08 NOTE — Telephone Encounter (Signed)
I assume he was referencing his Grandson, Nico's appointment. I have it canceled.

## 2023-01-10 ENCOUNTER — Other Ambulatory Visit (HOSPITAL_COMMUNITY): Payer: Self-pay

## 2023-01-11 ENCOUNTER — Other Ambulatory Visit (HOSPITAL_COMMUNITY): Payer: Self-pay

## 2023-01-28 ENCOUNTER — Ambulatory Visit: Payer: Commercial Managed Care - PPO | Admitting: Dermatology

## 2023-01-28 ENCOUNTER — Other Ambulatory Visit (HOSPITAL_COMMUNITY): Payer: Self-pay

## 2023-01-28 ENCOUNTER — Encounter: Payer: Self-pay | Admitting: Dermatology

## 2023-01-28 DIAGNOSIS — R21 Rash and other nonspecific skin eruption: Secondary | ICD-10-CM

## 2023-01-28 DIAGNOSIS — L309 Dermatitis, unspecified: Secondary | ICD-10-CM

## 2023-01-28 DIAGNOSIS — L089 Local infection of the skin and subcutaneous tissue, unspecified: Secondary | ICD-10-CM

## 2023-01-28 DIAGNOSIS — L219 Seborrheic dermatitis, unspecified: Secondary | ICD-10-CM

## 2023-01-28 DIAGNOSIS — L308 Other specified dermatitis: Secondary | ICD-10-CM | POA: Diagnosis not present

## 2023-01-28 MED ORDER — FLUOCINOLONE ACETONIDE BODY 0.01 % EX OIL
1.0000 | TOPICAL_OIL | Freq: Two times a day (BID) | CUTANEOUS | 2 refills | Status: AC
  Filled 2023-01-28: qty 118.28, 30d supply, fill #0

## 2023-01-28 MED ORDER — TRIAMCINOLONE ACETONIDE 0.1 % EX CREA
1.0000 | TOPICAL_CREAM | Freq: Two times a day (BID) | CUTANEOUS | 1 refills | Status: DC | PRN
  Filled 2023-01-28: qty 454, 25d supply, fill #0

## 2023-01-28 MED ORDER — DOXYCYCLINE HYCLATE 100 MG PO TABS
100.0000 mg | ORAL_TABLET | Freq: Two times a day (BID) | ORAL | 0 refills | Status: AC
  Filled 2023-01-28: qty 60, 30d supply, fill #0

## 2023-01-28 MED ORDER — TACROLIMUS 0.1 % EX OINT
TOPICAL_OINTMENT | Freq: Two times a day (BID) | CUTANEOUS | 0 refills | Status: DC
  Filled 2023-01-28: qty 90, 30d supply, fill #0

## 2023-01-28 NOTE — Progress Notes (Signed)
   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   Patient has hx of seborrheic dermatitis. A sulfa based compound was given before and that seemed to fix the problem. Recently traveled and rash came back after returning. Currently using Aveeno lotion, no prescription medications right now. Uses Head and Shoulders and that controls the itching momentarily. Itchy spot on right calf as well. C/o spider bite on right hand in February that still has not healed.   The following portions of the chart were reviewed this encounter and updated as appropriate: medications, allergies, medical history  Review of Systems:  No other skin or systemic complaints except as noted in HPI or Assessment and Plan.  Objective  Well appearing patient in no apparent distress; mood and affect are within normal limits.  A focused examination was performed of the following areas: trunk, face, and scalp   Relevant exam findings are noted in the Assessment and Plan.    Assessment & Plan  Rash --> ? Rickettsia Pox vs other viral exanthem vs id reaction Exam: Erythematous vesicular papules involving trunk and extremities.    Treatment Plan: -Start Doxycycline 100mg  tablet twice a day with food for 7 days -Triamcinolone cream twice a day to the body for up to two weeks for itch -Tacrolimus to the face twice a day for up to two weeks for itch   Seborrheic Dermatitis Exam: greasy scales and erythema involving scalp  Treatment Plan:  -Dermasoothe oil to the scalp and beard at night. Leave on for an hour then wash off with DHS Zinc shampoo   Dermatitis, unspecified Trunk  Skin / nail biopsy - Trunk Type of biopsy: tangential   Informed consent: discussed and consent obtained   Timeout: patient name, date of birth, surgical site, and procedure verified   Procedure prep:  Patient was prepped and draped in usual sterile fashion Prep  type:  Isopropyl alcohol Anesthesia: the lesion was anesthetized in a standard fashion   Anesthetic:  1% lidocaine w/ epinephrine 1-100,000 buffered w/ 8.4% NaHCO3 Instrument used: DermaBlade   Hemostasis achieved with: aluminum chloride   Outcome: patient tolerated procedure well   Post-procedure details: sterile dressing applied and wound care instructions given   Dressing type: petrolatum    Specimen 1 - Surgical pathology Differential Diagnosis: Viral exanthem vs gutatte psoriasis   Check Margins: No  Related Procedures Spotted Fever Group Antibodies  Local infection of skin and subcutaneous tissue    Return in about 2 weeks (around 02/11/2023) for itch follow up.    Documentation: I have reviewed the above documentation for accuracy and completeness, and I agree with the above.  Langston Reusing, DO  I, Germaine Pomfret, CMA, am acting as scribe for [employer], DO.

## 2023-01-28 NOTE — Patient Instructions (Addendum)
Patient Handout: Wound Care for Skin Biopsy Site  Patient Handout: Wound Care for Skin Biopsy Site  Taking Care of Your Skin Biopsy Site  Proper care of the biopsy site is essential for promoting healing and minimizing scarring. This handout provides instructions on how to care for your biopsy site to ensure optimal recovery.  1. Cleaning the Wound:  Clean the biopsy site daily with gentle soap and water. Gently pat the area dry with a clean, soft towel. Avoid harsh scrubbing or rubbing the area, as this can irritate the skin and delay healing.  2. Applying Aquaphor and Bandage:  After cleaning the wound, apply a thin layer of Aquaphor ointment to the biopsy site. Cover the area with a sterile bandage to protect it from dirt, bacteria, and friction. Change the bandage daily or as needed if it becomes soiled or wet.  3. Continued Care for One Week:  Repeat the cleaning, Aquaphor application, and bandaging process daily for one week following the biopsy procedure. Keeping the wound clean and moist during this initial healing period will help prevent infection and promote optimal healing.  4. Massaging Aquaphor into the Area:  ---After one week, discontinue the use of bandages but continue to apply Aquaphor to the biopsy site. ----Gently massage the Aquaphor into the area using circular motions. ---Massaging the skin helps to promote circulation and prevent the formation of scar tissue.   Due to recent changes in healthcare laws, you may see results of your pathology and/or laboratory studies on MyChart before the doctors have had a chance to review them. We understand that in some cases there may be results that are confusing or concerning to you. Please understand that not all results are received at the same time and often the doctors may need to interpret multiple results in order to provide you with the best plan of care or course of treatment. Therefore, we ask that you please give  [place] 2 business days to thoroughly review all your results before contacting the office for clarification. Should we see a critical lab result, you will be contacted sooner.   If You Need Anything After Your Visit  If you have any questions or concerns for your doctor, please call our main line at [phone] If no one answers, please leave a voicemail as directed and we will return your call as soon as possible. Messages left after 4 pm will be answered the following business day.   You may also send [place] a message via MyChart. We typically respond to MyChart messages within 1-2 business days.  For prescription refills, please ask your pharmacy to contact our office. Our fax number is [phone].  If you have an urgent issue when the clinic is closed that cannot wait until the next business day, you can page your doctor at the number below.    Please note that while we do our best to be available for urgent issues outside of office hours, we are not available 24/7.   If you have an urgent issue and are unable to reach [place], you may choose to seek medical care at your doctor's office, retail clinic, urgent care center, or emergency room.  If you have a medical emergency, please immediately call 911 or go to the emergency department. In the event of inclement weather, please call our main line at [phone] for an update on the status of any delays or closures.  Dermatology Medication Tips: Please keep the boxes that topical medications come in  in order to help keep track of the instructions about where and how to use these. Pharmacies typically print the medication instructions only on the boxes and not directly on the medication tubes.   If your medication is too expensive, please contact our office at [phone] or send [place] a message through MyChart.   We are unable to tell what your co-pay for medications will be in advance as this is different depending on your insurance coverage.  However, we may be able to find a substitute medication at lower cost or fill out paperwork to get insurance to cover a needed medication.   If a prior authorization is required to get your medication covered by your insurance company, please allow [place] 1-2 business days to complete this process.  Drug prices often vary depending on where the prescription is filled and some pharmacies may offer cheaper prices.  The website www.goodrx.com contains coupons for medications through different pharmacies. The prices here do not account for what the cost may be with help from insurance (it may be cheaper with your insurance), but the website can give you the price if you did not use any insurance.  - You can print the associated coupon and take it with your prescription to the pharmacy.  - You may also stop by our office during regular business hours and pick up a GoodRx coupon card.  - If you need your prescription sent electronically to a different pharmacy, notify our office through [facility] or by phone at [phone]

## 2023-01-29 ENCOUNTER — Other Ambulatory Visit: Payer: Self-pay | Admitting: General Surgery

## 2023-01-29 ENCOUNTER — Other Ambulatory Visit: Payer: Self-pay | Admitting: Internal Medicine

## 2023-01-29 ENCOUNTER — Other Ambulatory Visit: Payer: Self-pay | Admitting: Family

## 2023-01-29 ENCOUNTER — Other Ambulatory Visit (HOSPITAL_COMMUNITY): Payer: Self-pay

## 2023-01-29 ENCOUNTER — Encounter: Payer: Self-pay | Admitting: Internal Medicine

## 2023-01-29 MED ORDER — MUPIROCIN 2 % EX OINT
1.0000 | TOPICAL_OINTMENT | Freq: Two times a day (BID) | CUTANEOUS | 0 refills | Status: DC
  Filled 2023-01-29: qty 22, 11d supply, fill #0

## 2023-01-29 MED ORDER — FLUTICASONE PROPIONATE 50 MCG/ACT NA SUSP
1.0000 | NASAL | 1 refills | Status: AC | PRN
  Filled 2023-01-29: qty 16, 60d supply, fill #0
  Filled 2023-07-26: qty 16, 60d supply, fill #1

## 2023-01-29 MED ORDER — AZITHROMYCIN 250 MG PO TABS
250.0000 mg | ORAL_TABLET | Freq: Every day | ORAL | 0 refills | Status: DC
  Filled 2023-01-29: qty 6, 5d supply, fill #0

## 2023-01-30 ENCOUNTER — Other Ambulatory Visit (HOSPITAL_COMMUNITY): Payer: Self-pay

## 2023-01-31 LAB — SPOTTED FEVER GROUP ANTIBODIES
Spotted Fever Group IgG: <1:64 {titer}
Spotted Fever Group IgM: <1:64 {titer}

## 2023-02-05 ENCOUNTER — Telehealth: Payer: Self-pay | Admitting: Dermatology

## 2023-02-05 NOTE — Telephone Encounter (Signed)
Called pt to discuss biopsy and labs on 02/05/23.  No answer --> LVM

## 2023-02-07 ENCOUNTER — Other Ambulatory Visit: Payer: Self-pay

## 2023-02-12 ENCOUNTER — Ambulatory Visit: Payer: Commercial Managed Care - PPO | Admitting: Dermatology

## 2023-02-12 ENCOUNTER — Other Ambulatory Visit (HOSPITAL_COMMUNITY): Payer: Self-pay

## 2023-02-12 DIAGNOSIS — L309 Dermatitis, unspecified: Secondary | ICD-10-CM | POA: Diagnosis not present

## 2023-02-12 MED ORDER — CLOTRIMAZOLE-BETAMETHASONE 1-0.05 % EX CREA
1.0000 | TOPICAL_CREAM | Freq: Two times a day (BID) | CUTANEOUS | 1 refills | Status: DC
  Filled 2023-02-12: qty 30, 15d supply, fill #0

## 2023-02-12 NOTE — Progress Notes (Signed)
   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   Pt states that the rash is significantly better.   He presents to review biopsy results today.   The following portions of the chart were reviewed this encounter and updated as appropriate: medications, allergies, medical history  Review of Systems:  No other skin or systemic complaints except as noted in HPI or Assessment and Plan.  Objective  Well appearing patient in no apparent distress; mood and affect are within normal limits.  A focused examination was performed of the following areas: Scalp, Hand, and calf  Relevant exam findings are noted in the Assessment and Plan.  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 Infiltrates of lymphocytes and eosinophils are present within the dermis. 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 These changes may be seen in a variety of settings, including contact dermatitis, nummular dermatitis and also atopic dermatitis. Seborrheic dermatitis is also consideration histologically. A special stain (PAS-F) is performed to determine the presence or absence of fungal hyphae in this biopsy. The PAS stain is negative for fungal hyphae, and the controls stained appropriately.  Assessment & Plan   Eczematous Dermatitis  Exam: Resolving pruritic papules  Treatment Plan: -Start Clotrimazole Betamethasone cream on hand and calf twice a day for up to two weeks. Alternate with Tacrolimus -Continue using Tacrolimus around the eyes as needed for itch -Reviewed bx report with  patient in detail and discussed that the results are indicative of an eczema like skin reaction.  No obvious triggers.  I suspect a possible ID reaction secondary to the infection on his right hand which is currently resolving.  -Will continue to monitor.  -Scalp: continue fluocinonide oil and DHS zinc shampoo.  Return in about 2 months (around 04/14/2023) for Scalp irritation.    Documentation: I have reviewed the above documentation for accuracy and completeness, and I agree with the above.  Langston Reusing, MD  Germaine Pomfret, CMA, am acting as scribe for Langston Reusing, MD.

## 2023-02-12 NOTE — Patient Instructions (Addendum)
Due to recent changes in healthcare laws, you may see results of your pathology and/or laboratory studies on MyChart before the doctors have had a chance to review them. We understand that in some cases there may be results that are confusing or concerning to you. Please understand that not all results are received at the same time and often the doctors may need to interpret multiple results in order to provide you with the best plan of care or course of treatment. Therefore, we ask that you please give us 2 business days to thoroughly review all your results before contacting the office for clarification. Should we see a critical lab result, you will be contacted sooner.   If You Need Anything After Your Visit  If you have any questions or concerns for your doctor, please call our main line at [phone] If no one answers, please leave a voicemail as directed and we will return your call as soon as possible. Messages left after 4 pm will be answered the following business day.   You may also send us a message via MyChart. We typically respond to MyChart messages within 1-2 business days.  For prescription refills, please ask your pharmacy to contact our office. Our fax number is [phone].  If you have an urgent issue when the clinic is closed that cannot wait until the next business day, you can page your doctor at the number below.    Please note that while we do our best to be available for urgent issues outside of office hours, we are not available 24/7.   If you have an urgent issue and are unable to reach us, you may choose to seek medical care at your doctor's office, retail clinic, urgent care center, or emergency room.  If you have a medical emergency, please immediately call 911 or go to the emergency department. In the event of inclement weather, please call our main line at [phone] for an update on the status of any delays or closures.  Dermatology Medication Tips: Please  keep the boxes that topical medications come in in order to help keep track of the instructions about where and how to use these. Pharmacies typically print the medication instructions only on the boxes and not directly on the medication tubes.   If your medication is too expensive, please contact our office at [phone] or send us a message through MyChart.   We are unable to tell what your co-pay for medications will be in advance as this is different depending on your insurance coverage. However, we may be able to find a substitute medication at lower cost or fill out paperwork to get insurance to cover a needed medication.   If a prior authorization is required to get your medication covered by your insurance company, please allow us 1-2 business days to complete this process.  Drug prices often vary depending on where the prescription is filled and some pharmacies may offer cheaper prices.  The website www.goodrx.com contains coupons for medications through different pharmacies. The prices here do not account for what the cost may be with help from insurance (it may be cheaper with your insurance), but the website can give you the price if you did not use any insurance.  - You can print the associated coupon and take it with your prescription to the pharmacy.  - You may also stop by our office during regular business hours and pick up a GoodRx coupon card.  - If you need your   prescription sent electronically to a different pharmacy, notify our office through [facility] My[facility] or by phone at [phone]     

## 2023-02-13 ENCOUNTER — Ambulatory Visit: Payer: Commercial Managed Care - PPO | Admitting: Dermatology

## 2023-03-06 ENCOUNTER — Encounter: Payer: Self-pay | Admitting: Dermatology

## 2023-04-16 ENCOUNTER — Encounter: Payer: Self-pay | Admitting: Dermatology

## 2023-04-16 ENCOUNTER — Ambulatory Visit (INDEPENDENT_AMBULATORY_CARE_PROVIDER_SITE_OTHER): Payer: Commercial Managed Care - PPO | Admitting: Dermatology

## 2023-04-16 VITALS — BP 132/81 | HR 68

## 2023-04-16 DIAGNOSIS — L309 Dermatitis, unspecified: Secondary | ICD-10-CM

## 2023-04-16 DIAGNOSIS — L219 Seborrheic dermatitis, unspecified: Secondary | ICD-10-CM | POA: Diagnosis not present

## 2023-04-16 NOTE — Patient Instructions (Addendum)
Here is a summary of our discussion and the treatment plan moving forward:  - Hand Dermatitis:   - Continue using Tacrolimus ointment twice daily until the lesion is resolved.   - Consider wearing gloves when washing dishes or using cleaning supplies to prevent irritation.   - If the condition does not improve in two weeks or worsens, please send me a message via MyChart for further evaluation.   - We discussed the option to freeze the small spots if they do not respond to the current treatment, which can be revisited if necessary.  -Seborrheic Dermatitis:   - Continue alternating DHS shampoo with Head and Shoulders to manage your seborrheic dermatitis.   - Use the prescribed oil if you experience a flare-up.  - General Advice:   - Ensure to wear sunscreen regularly, especially on areas with hyperpigmentation.   - Monitor the condition of your skin, and do not hesitate to contact me if there are any new developments or concerns.  Your scalp appears to be well-managed with the current regimen, and your hand dermatitis is showing signs of improvement. Please follow the outlined steps and keep me updated on your progress. If you have any further questions or need additional guidance, feel free to reach out.      Due to recent changes in healthcare laws, you may see results of your pathology and/or laboratory studies on MyChart before the doctors have had a chance to review them. We understand that in some cases there may be results that are confusing or concerning to you. Please understand that not all results are received at the same time and often the doctors may need to interpret multiple results in order to provide you with the best plan of care or course of treatment. Therefore, we ask that you please give [place] 2 business days to thoroughly review all your results before contacting the office for clarification. Should we see a critical lab result, you will be contacted sooner.   If You Need  Anything After Your Visit  If you have any questions or concerns for your doctor, please call our main line at [phone] If no one answers, please leave a voicemail as directed and we will return your call as soon as possible. Messages left after 4 pm will be answered the following business day.   You may also send [place] a message via MyChart. We typically respond to MyChart messages within 1-2 business days.  For prescription refills, please ask your pharmacy to contact our office. Our fax number is [phone].  If you have an urgent issue when the clinic is closed that cannot wait until the next business day, you can page your doctor at the number below.    Please note that while we do our best to be available for urgent issues outside of office hours, we are not available 24/7.   If you have an urgent issue and are unable to reach [place], you may choose to seek medical care at your doctor's office, retail clinic, urgent care center, or emergency room.  If you have a medical emergency, please immediately call 911 or go to the emergency department. In the event of inclement weather, please call our main line at [phone] for an update on the status of any delays or closures.  Dermatology Medication Tips: Please keep the boxes that topical medications come in in order to help keep track of the instructions about where and how to use these. Pharmacies typically print the  medication instructions only on the boxes and not directly on the medication tubes.   If your medication is too expensive, please contact our office at [phone] or send [place] a message through MyChart.   We are unable to tell what your co-pay for medications will be in advance as this is different depending on your insurance coverage. However, we may be able to find a substitute medication at lower cost or fill out paperwork to get insurance to cover a needed medication.   If a prior authorization is required to get your  medication covered by your insurance company, please allow [place] 1-2 business days to complete this process.  Drug prices often vary depending on where the prescription is filled and some pharmacies may offer cheaper prices.  The website www.goodrx.com contains coupons for medications through different pharmacies. The prices here do not account for what the cost may be with help from insurance (it may be cheaper with your insurance), but the website can give you the price if you did not use any insurance.  - You can print the associated coupon and take it with your prescription to the pharmacy.  - You may also stop by our office during regular business hours and pick up a GoodRx coupon card.  - If you need your prescription sent electronically to a different pharmacy, notify our office through [facility] or by phone at [phone]

## 2023-04-16 NOTE — Progress Notes (Signed)
   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 The rash on his body has subsided after seeing the provider, but the lesion on his hand went away and then returned after a trip to [place]. He has been using clotrimazole, betamethasone, and alternating with tacrolimus for the hand rash. The rash hashealed up but has not completely resolved. For his scalp, the patient has been using drops and DHS for seborrheic dermatitis, which has improved significantly. He is now alternating DHS with Head and Shoulders and reports that his scalp condition is well controlled.  The patient reports that the hand rash initially improved but returned after his trip to [place]. He applied Bactroban on the affected area, which developed a black eschar that sealed but wanted to open up again. He used tacrolimus with Bactroban, which helped. The patient was concerned about the rash spreading as he noticed little dots over the knuckle area, but they have been shrinking with the ointment application.  The patient believes that the heat and water in [place] may have contributed to the hand rash. The rash initially occurred before the trip in February, and the trip seemed to exacerbate the condition. The affected area now has hyperpigmentation, and the patient has been wearing sunscreen. He also reports frequently washing dishes and cleaning without gloves, which may have triggered the dermatitis.  Regarding his scalp, the patient is using DHS shampoo and has recently started using Head and Shoulders every other day. He has not needed to use the oil for his scalp in the past two and a half to three weeks, as the shampoo has been keeping the seborrheic dermatitis under control.      The following portions of the chart were reviewed this encounter and updated as appropriate:  medications, allergies, medical history  Review of Systems:  No other skin or systemic complaints except as noted in HPI or Assessment and Plan.  Objective    Well appearing patient in no apparent distress; mood and affect are within normal limits.   A focused examination was performed of the following areas:face and hands.   Relevant exam findings are noted in the Assessment and Plan.    Assessment & Plan   1. Hand Dermatitis: - Diagnosis: Dermatitis with a lichenified plaque in the right dorsal hand in an area of a prior wound.  - Treatment plan:   a. Continue using tacrolimus twice a day until the lesion resolves.   b. If no improvement in two weeks, patient to send a message for re-evaluation.   c. Wear gloves when washing dishes and cleaning to avoid exposure to irritating detergents.   d. Monitor for any changes in the lesion and contact the clinic if it worsens or recurs.   2. Seborrheic Dermatitis (Scalp): - Diagnosis: Seborrheic dermatitis, currently well controlled.  - Treatment plan:   a. Continue alternating DHS shampoo with Head and Shoulders for maintenance.   b. Use oil as needed for flares.   c. Follow up as needed if symptoms worsen or new concerns arise.    Return if symptoms worsen or fail to improve.  Owens Shark, CMA, am acting as scribe for [employer], DO.   Documentation: I have reviewed the above documentation for accuracy and completeness, and I agree with the above.  Langston Reusing, DO

## 2023-06-14 ENCOUNTER — Other Ambulatory Visit (HOSPITAL_COMMUNITY): Payer: Self-pay

## 2023-06-21 ENCOUNTER — Other Ambulatory Visit (HOSPITAL_COMMUNITY): Payer: Self-pay

## 2023-06-21 MED ORDER — ACYCLOVIR 5 % EX OINT
1.0000 | TOPICAL_OINTMENT | Freq: Every day | CUTANEOUS | 2 refills | Status: DC
  Filled 2023-06-21: qty 15, 3d supply, fill #0

## 2023-07-16 ENCOUNTER — Encounter: Payer: Self-pay | Admitting: Dermatology

## 2023-07-16 ENCOUNTER — Ambulatory Visit: Payer: Commercial Managed Care - PPO | Admitting: Dermatology

## 2023-07-16 DIAGNOSIS — D485 Neoplasm of uncertain behavior of skin: Secondary | ICD-10-CM

## 2023-07-16 DIAGNOSIS — L308 Other specified dermatitis: Secondary | ICD-10-CM

## 2023-07-16 DIAGNOSIS — D492 Neoplasm of unspecified behavior of bone, soft tissue, and skin: Secondary | ICD-10-CM

## 2023-07-16 NOTE — Progress Notes (Signed)
   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 Patient was last evaluated on 04/16/23. Patient reports sxs are better but seems to be returning. The Tacrolimus and TMC creams were ineffective so he started using Wound cream and it started to heal. Patient denies medication changes.  The following portions of the chart were reviewed this encounter and updated as appropriate: medications, allergies, medical history  Review of Systems:  No other skin or systemic complaints except as noted in HPI or Assessment and Plan.  Objective  Well appearing patient in no apparent distress; mood and affect are within normal limits.  A focused examination was performed of the following areas: Hand  Relevant exam findings are noted in the Assessment and Plan.  Right Hand - Posterior Pink Lichenified plaque with tiny opened superficial ulcerations on the rim            Assessment & Plan   Neoplasm of uncertain behavior of skin Right Hand - Posterior  Skin / nail biopsy Type of biopsy: tangential   Informed consent: discussed and consent obtained   Timeout: patient name, date of birth, surgical site, and procedure verified   Procedure prep:  Patient was prepped and draped in usual sterile fashion Prep type:  Isopropyl alcohol Anesthesia: the lesion was anesthetized in a standard fashion   Anesthetic:  1% lidocaine w/ epinephrine 1-100,000 buffered w/ 8.4% NaHCO3 Instrument used: DermaBlade   Hemostasis achieved with: aluminum chloride   Outcome: patient tolerated procedure well   Post-procedure details: sterile dressing applied and wound care instructions given   Dressing type: petrolatum gauze and bandage    Specimen 1 - Surgical pathology Differential Diagnosis: Tinea vs Psoriasis vs Impetigo  Check Margins: No    Return in about 6 years  (around 07/15/2029) for Right Hand Skin Lesion.   Documentation: I have reviewed the above documentation for accuracy and completeness, and I agree with the above.  Stasia Cavalier, am acting as scribe for Langston Reusing, DO.  Langston Reusing, DO

## 2023-07-16 NOTE — Patient Instructions (Signed)
Important Information   Due to recent changes in healthcare laws, you may see results of your pathology and/or laboratory studies on MyChart before the doctors have had a chance to review them. We understand that in some cases there may be results that are confusing or concerning to you. Please understand that not all results are received at the same time and often the doctors may need to interpret multiple results in order to provide you with the best plan of care or course of treatment. Therefore, we ask that you please give [place] 2 business days to thoroughly review all your results before contacting the office for clarification. Should we see a critical lab result, you will be contacted sooner.     If You Need Anything After Your Visit   If you have any questions or concerns for your doctor, please call our main line at [phone]. If no one answers, please leave a voicemail as directed and we will return your call as soon as possible. Messages left after 4 pm will be answered the following business day.    You may also send [place] a message via MyChart. We typically respond to MyChart messages within 1-2 business days.  For prescription refills, please ask your pharmacy to contact our office. Our fax number is [phone].  If you have an urgent issue when the clinic is closed that cannot wait until the next business day, you can page your doctor at the number below.     Please note that while we do our best to be available for urgent issues outside of office hours, we are not available 24/7.    If you have an urgent issue and are unable to reach [place], you may choose to seek medical care at your doctor's office, retail clinic, urgent care center, or emergency room.   If you have a medical emergency, please immediately call 911 or go to the emergency department. In the event of inclement weather, please call our main line at [phone] for an update on the status of any delays or  closures.  Dermatology Medication Tips: Please keep the boxes that topical medications come in in order to help keep track of the instructions about where and how to use these. Pharmacies typically print the medication instructions only on the boxes and not directly on the medication tubes.   If your medication is too expensive, please contact our office at [phone] or send [place] a message through MyChart.    We are unable to tell what your co-pay for medications will be in advance as this is different depending on your insurance coverage. However, we may be able to find a substitute medication at lower cost or fill out paperwork to get insurance to cover a needed medication.    If a prior authorization is required to get your medication covered by your insurance company, please allow [place] 1-2 business days to complete this process.   Drug prices often vary depending on where the prescription is filled and some pharmacies may offer cheaper prices.   The website www.goodrx.com contains coupons for medications through different pharmacies. The prices here do not account for what the cost may be with help from insurance (it may be cheaper with your insurance), but the website can give you the price if you did not use any insurance.  - You can print the associated coupon and take it with your prescription to the pharmacy.  - You may also stop by our office during regular  business hours and pick up a GoodRx coupon card.  - If you need your prescription sent electronically to a different pharmacy, notify our office through [facility] or by phone at [phone]

## 2023-07-17 LAB — SURGICAL PATHOLOGY

## 2023-07-23 NOTE — Progress Notes (Signed)
Hello Dr. Chrystine Oiler,  You pathology report came back and it showed spongiotic dermatitis with eosinophils (histologically consistent with a diagnosis of nummular eczema or contact dermatitis)  Given the recurrent nature of the plaque I favor nummular eczema clinically.  There were also elements of bacterial cocci so the lesion also had secondarily impetiginized.  Fortunately, no fungal or parasitic worms were noted.   If the nummular eczema flares again apply the topical corticosteroid immediately and avoid scratching as to avoid secondary infection.    Best,  Dr. Onalee Hua

## 2023-07-26 ENCOUNTER — Other Ambulatory Visit (HOSPITAL_COMMUNITY): Payer: Self-pay

## 2023-07-26 ENCOUNTER — Other Ambulatory Visit: Payer: Self-pay | Admitting: Internal Medicine

## 2023-07-26 ENCOUNTER — Other Ambulatory Visit: Payer: Self-pay

## 2023-07-26 ENCOUNTER — Encounter (HOSPITAL_COMMUNITY): Payer: Self-pay

## 2023-07-26 ENCOUNTER — Other Ambulatory Visit: Payer: Self-pay | Admitting: Family

## 2023-07-26 MED ORDER — MONTELUKAST SODIUM 10 MG PO TABS
10.0000 mg | ORAL_TABLET | Freq: Every evening | ORAL | 0 refills | Status: DC | PRN
  Filled 2023-07-26: qty 30, 30d supply, fill #0

## 2023-08-09 ENCOUNTER — Other Ambulatory Visit (HOSPITAL_COMMUNITY): Payer: Self-pay

## 2023-08-12 IMAGING — CT CT CHEST SUPER D W/O CM
3 of 10 series · 13 of 36 positions shown, 16 images · non-contrast
Comparison: CT 02/08/2021

CLINICAL DATA: Follow-up pulmonary nodule.

EXAM:
CT CHEST WITHOUT CONTRAST
TECHNIQUE: Multidetector CT imaging of the chest was performed using thin slice
collimation for electromagnetic bronchoscopy planning purposes,
without intravenous contrast.

[Series 5: coronal · coronal · 0.58mm/px · 1 of 129 slices shown]
[im 65/129  lung]
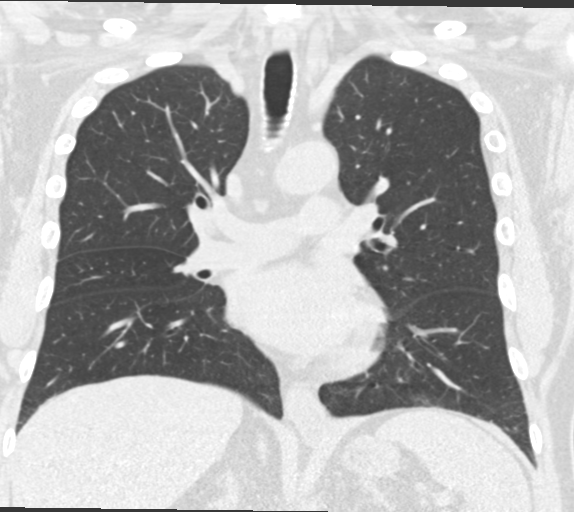

[Series 7: super d · axial · 0.71mm/px · z∈[-306,-67]mm · 10 of 367 slices shown, 13 images (1 of 2)]
[im 34/367  mediastinal]
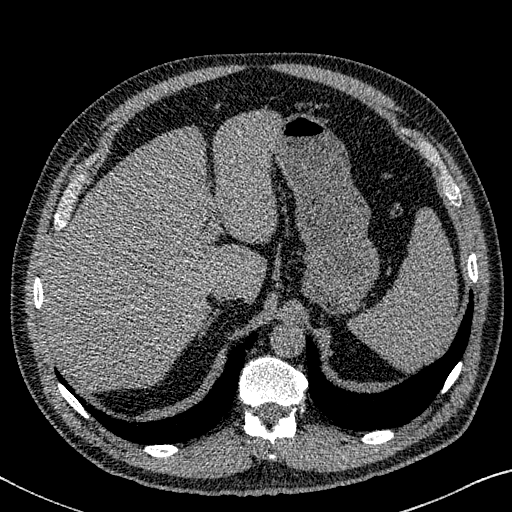
[im 34/367  lung]
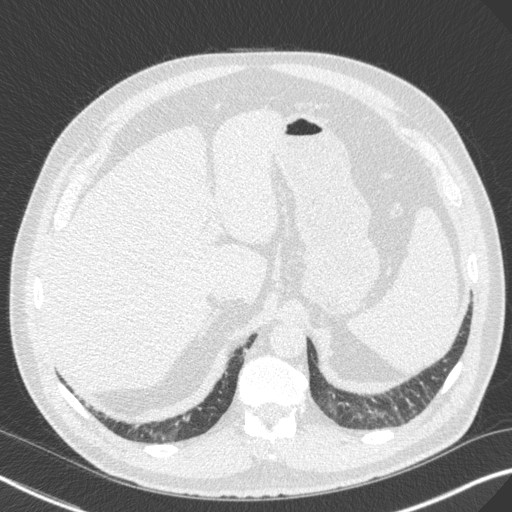
[im 67/367  lung]
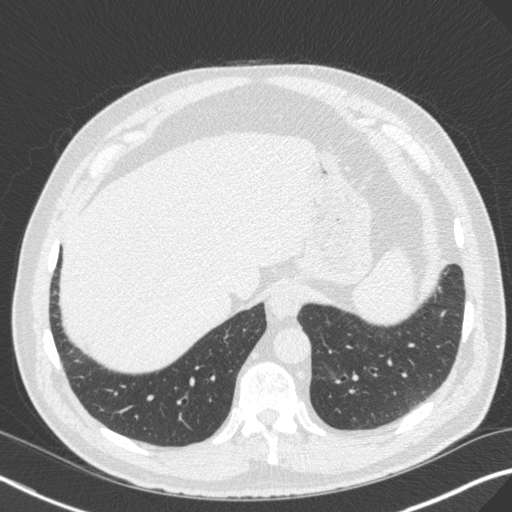
[im 100/367  lung]
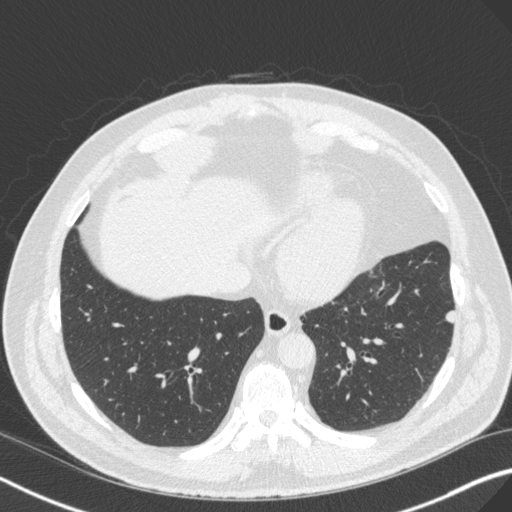
[im 134/367  lung]
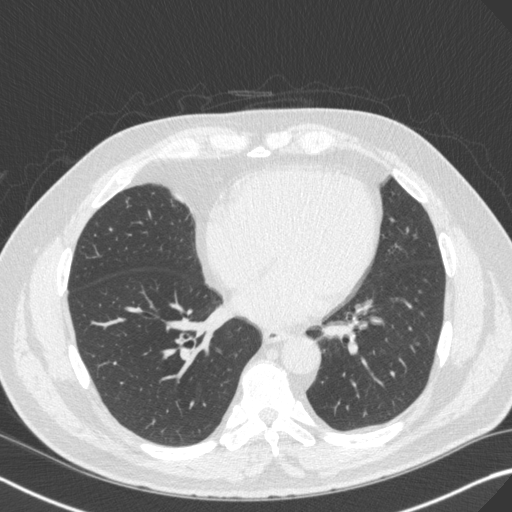
[im 167/367  mediastinal]
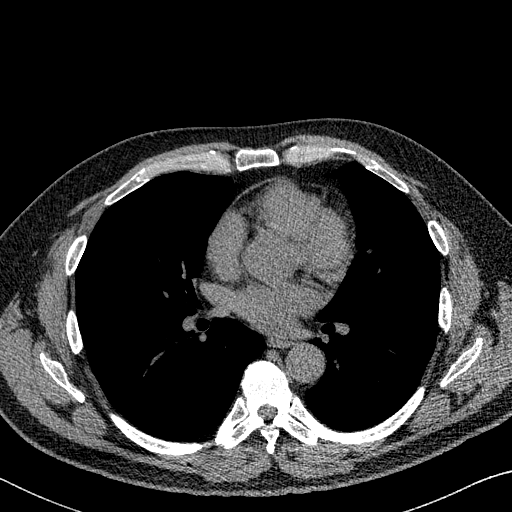
[im 167/367  lung]
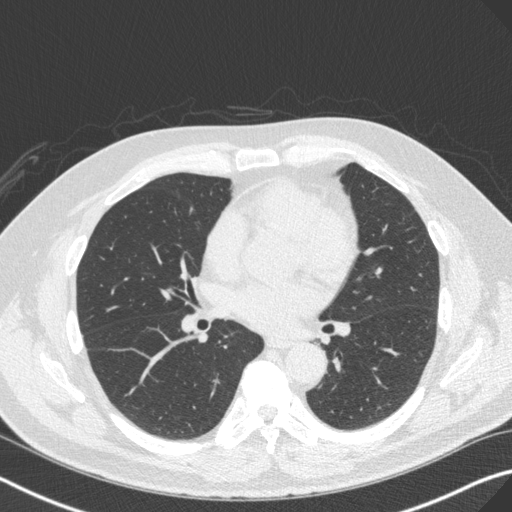
[im 200/367  lung]
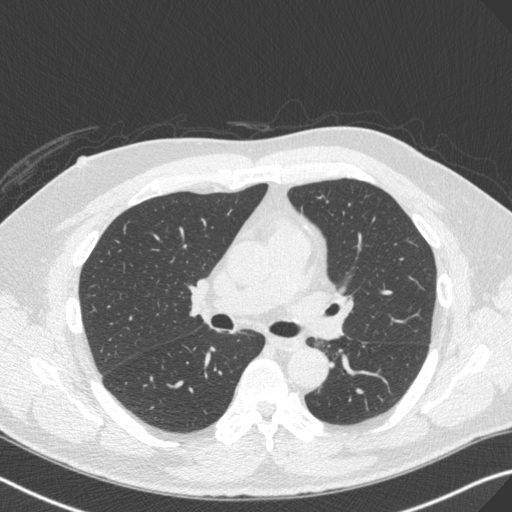
[im 233/367  lung]
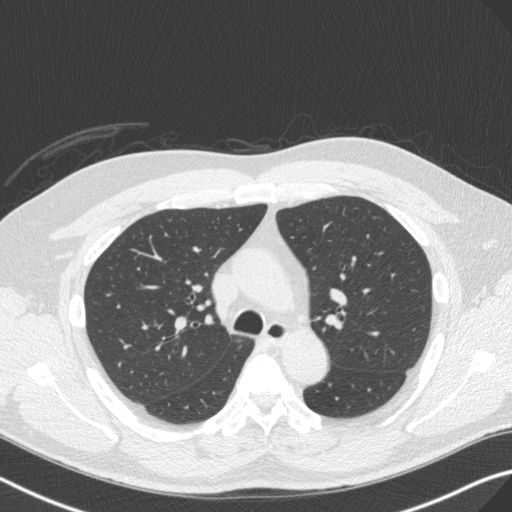
[im 267/367  lung]
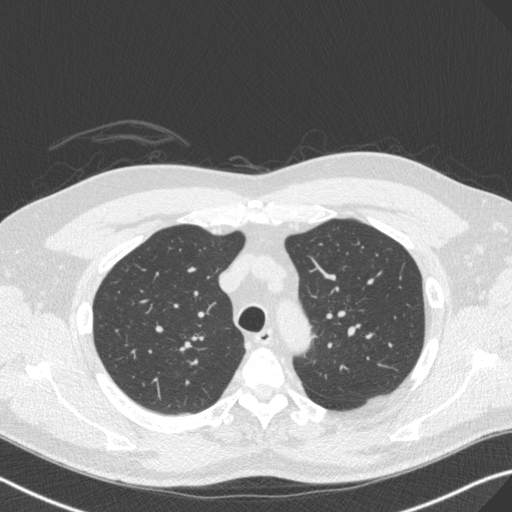
[im 300/367  mediastinal]
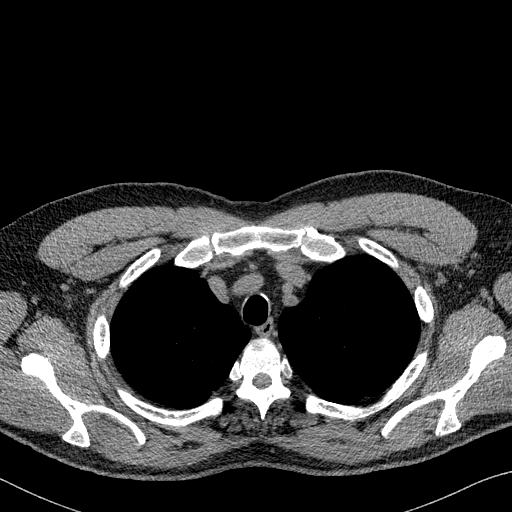
[im 300/367  lung]
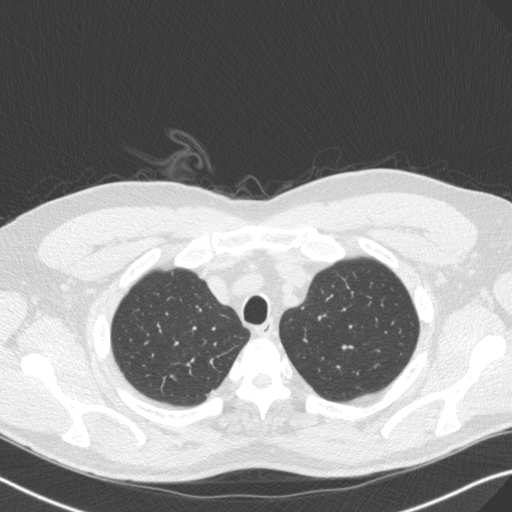
[im 333/367  lung]
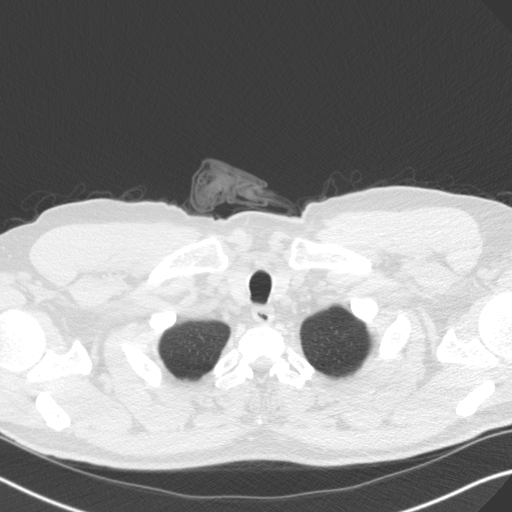

[Series 13: super d · axial · 0.71mm/px · z∈[-360,-331]mm · 2 of 113 slices shown (2 of 2)]
[im 38/113  lung]
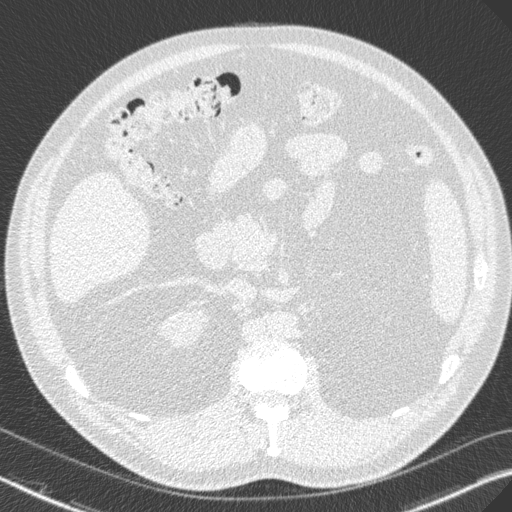
[im 75/113  lung]
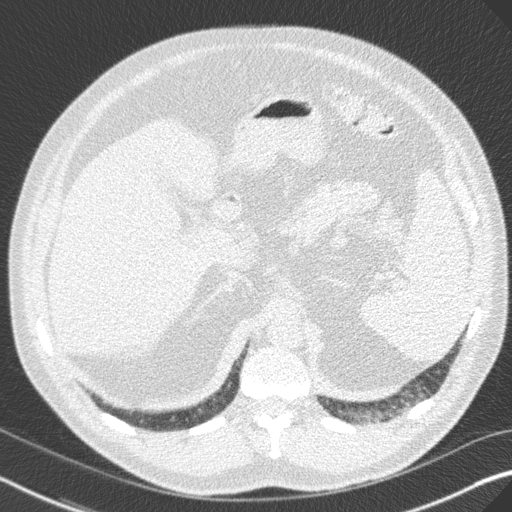

[13 of 36 positions shown; findings below may reference images not displayed]

FINDINGS: Cardiovascular: No significant vascular findings. Normal heart size.
No pericardial effusion.

Mediastinum/Nodes: No axillary or supraclavicular adenopathy. No
mediastinal or hilar adenopathy. No pericardial fluid. Esophagus
normal.

Lungs/Pleura: 7 mm nodule in the LEFT lower lobe (image 108/3) is
unchanged.

Pleuroparenchymal thickening in the RIGHT middle lobe unchanged.

Small nodule in the RIGHT lung base measuring 3 mm (image 127/3)
unchanged.

Upper Abdomen: Limited view of the liver, kidneys, pancreas are
unremarkable. Normal adrenal glands.

Musculoskeletal: No aggressive osseous lesion.
IMPRESSION: 1. No change in size of dominant nodule in the LEFT lower lobe. CT
at 12 months (from today's scan) is considered optional for low-risk
patients, but is recommended for high-risk patients. This
recommendation follows the consensus statement: Guidelines for
Management of Incidental Pulmonary Nodules Detected on CT Images:
2. Stable small RIGHT lung pulmonary nodules

## 2023-08-27 ENCOUNTER — Ambulatory Visit: Payer: Commercial Managed Care - PPO | Admitting: Dermatology

## 2023-08-27 ENCOUNTER — Encounter: Payer: Self-pay | Admitting: Dermatology

## 2023-08-27 VITALS — BP 139/89

## 2023-08-27 DIAGNOSIS — L309 Dermatitis, unspecified: Secondary | ICD-10-CM | POA: Diagnosis not present

## 2023-08-27 DIAGNOSIS — L810 Postinflammatory hyperpigmentation: Secondary | ICD-10-CM

## 2023-08-27 NOTE — Progress Notes (Signed)
   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 Rash has cleared up and biopsy site is still healing. He is still using bactroban and Aquaphor on the area. The eczematous dermatitis has come back on his back. He has been treating it with Tacrolimus.    The following portions of the chart were reviewed this encounter and updated as appropriate: medications, allergies, medical history  Review of Systems:  No other skin or systemic complaints except as noted in HPI or Assessment and Plan.  Objective  Well appearing patient in no apparent distress; mood and affect are within normal limits.   A focused examination was performed of the following areas: right hand  Relevant exam findings are noted in the Assessment and Plan.  PATHOLOGY REPORT Diagnosis Skin , right hand - posterior PSORIASIFORM AND SPONGIOTIC DERMATITIS, IMPETIGINIZED, EXCORIATED Microscopic Description There is regular acanthosis with spongiosis and parakeratosis. Infiltrates of lymphocytes and eosinophils are present within the dermis. 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 These changes may be seen in a variety of settings, including contact dermatitis, nummular dermatitis and also atopic dermatitis. Seborrheic dermatitis is also a consideration histologically. There is a purulent scale crust containing bacterial cocci, suggesting an impetiginized lesion. There is focal epidermal necrosis consistent with excoriation. A special stain (PAS-F) is performed to determine the presence or absence of fungal hyphae in this biopsy. The PAS stain is negative for fungal hyphae, and the controls stained appropriately.  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 Alternate every 2 weeks with Tacrolimus ointment. Consider a steroid dose pack if there is no improvement with the current treatment plan. -Use fragrance-free laundry detergent and minimal fragrance body washes (e.g., Dove sensitive skin). Moisturize daily with Eucerin cream. Avoid hot showers and consider Aveeno oatmeal packets for baths. Monitor rash and send a MyChart message if additional triamcinolone or tacrolimus is needed.  Nummular Eczema with Secondary Impetiginization (Resolved) - Assessment: Reviewed biopsy results in detail with pt, lesions has fully resolved with mild PIH present - Plan: Continue using Aquaphor and massage the bx area.      Post Inflammatory Hyperpigmentation from Previous Rash - Assessment: Patient presents with hyperpigmentation following a previous rash. - Plan: No specific treatment recommended at this time, as the hyperpigmentation is expected to resolve on its own.     No follow-ups on file.  I, Joanie Coddington, CMA, am acting as scribe for [employer], DO .   Documentation: I have reviewed the above documentation for accuracy and completeness, and I agree with the above.  Langston Reusing, DO

## 2023-08-27 NOTE — Patient Instructions (Addendum)
Hello Dr. Chrystine James,   Thank you for visiting my office today. Your dedication to managing your dermatological condition and improving your health is greatly appreciated. Below is a summary of our discussion and the outlined treatment plan:  - Medications and Application Instructions:   - Aquaphor: Continue using Aquaphor, massaging the area to aid in the healing of the biopsy site.   - Triamcinolone Cream: Apply twice daily tp itchy areas on back for two weeks.   - Tacrolimus Ointment: Follow with tacrolimus ointment, also applied twice daily for another two weeks. Alternate between these treatments as needed until the itch and eczema dermatitis flare resolve     - Lifestyle and Skincare Adjustments:   - Laundry Detergent and Body Wash: Switch to fragrance-free options, such as Dove Sensitive Skin.   - Moisturizing: Use Excedrin cream daily to manage skin dryness, especially during colder months.   - Bathing: Avoid hot showers. Use Aveeno oatmeal bath packets for soaking to help hydrate the skin.  - Follow-Up and Monitoring:   - Communication: Keep an eye on your condition and message me via MyChart if you run out of tacrolimus or triamcinolone, or if there are any changes in your symptoms.   - Biopsy Results: We discussed your biopsy results, which confirmed nummular eczema with a secondary bacterial infection. No fungal or other organisms were identified.  Please do not hesitate to reach out if you have any questions or need further clarification on your treatment plan. I look forward to seeing you at your next appointment.  Warm regards,  Dr. Langston Reusing,  Dermatology        Important Information  Due to recent changes in healthcare laws, you may see results of your pathology and/or laboratory studies on MyChart before the doctors have had a chance to review them. We understand that in some cases there may be results that are confusing or concerning to you. Please understand  that not all results are received at the same time and often the doctors may need to interpret multiple results in order to provide you with the best plan of care or course of treatment. Therefore, we ask that you please give [place] 2 business days to thoroughly review all your results before contacting the office for clarification. Should we see a critical lab result, you will be contacted sooner.   If You Need Anything After Your Visit  If you have any questions or concerns for your doctor, please call our main line at [phone] If no one answers, please leave a voicemail as directed and we will return your call as soon as possible. Messages left after 4 pm will be answered the following business day.   You may also send [place] a message via MyChart. We typically respond to MyChart messages within 1-2 business days.  For prescription refills, please ask your pharmacy to contact our office. Our fax number is [phone].  If you have an urgent issue when the clinic is closed that cannot wait until the next business day, you can page your doctor at the number below.    Please note that while we do our best to be available for urgent issues outside of office hours, we are not available 24/7.   If you have an urgent issue and are unable to reach [place], you may choose to seek medical care at your doctor's office, retail clinic, urgent care center, or emergency room.  If you have a medical emergency, please immediately call 911 or  go to the emergency department. In the event of inclement weather, please call our main line at [phone] for an update on the status of any delays or closures.  Dermatology Medication Tips: Please keep the boxes that topical medications come in in order to help keep track of the instructions about where and how to use these. Pharmacies typically print the medication instructions only on the boxes and not directly on the medication tubes.   If your medication is too  expensive, please contact our office at [phone] or send [place] a message through MyChart.   We are unable to tell what your co-pay for medications will be in advance as this is different depending on your insurance coverage. However, we may be able to find a substitute medication at lower cost or fill out paperwork to get insurance to cover a needed medication.   If a prior authorization is required to get your medication covered by your insurance company, please allow [place] 1-2 business days to complete this process.  Drug prices often vary depending on where the prescription is filled and some pharmacies may offer cheaper prices.  The website www.goodrx.com contains coupons for medications through different pharmacies. The prices here do not account for what the cost may be with help from insurance (it may be cheaper with your insurance), but the website can give you the price if you did not use any insurance.  - You can print the associated coupon and take it with your prescription to the pharmacy.  - You may also stop by our office during regular business hours and pick up a GoodRx coupon card.  - If you need your prescription sent electronically to a different pharmacy, notify our office through [facility] or by phone at [phone]

## 2023-09-04 ENCOUNTER — Other Ambulatory Visit (HOSPITAL_COMMUNITY): Payer: Self-pay

## 2023-09-04 MED ORDER — COVID-19 MRNA VAC-TRIS(PFIZER) 30 MCG/0.3ML IM SUSY
0.3000 mL | PREFILLED_SYRINGE | Freq: Once | INTRAMUSCULAR | 0 refills | Status: AC
  Filled 2023-09-04: qty 0.3, 1d supply, fill #0

## 2023-09-04 MED ORDER — INFLUENZA VIRUS VACC SPLIT PF (FLUZONE) 0.5 ML IM SUSY
0.5000 mL | PREFILLED_SYRINGE | Freq: Once | INTRAMUSCULAR | 0 refills | Status: AC
  Filled 2023-09-04: qty 0.5, 1d supply, fill #0

## 2023-09-09 ENCOUNTER — Other Ambulatory Visit (HOSPITAL_COMMUNITY): Payer: Self-pay

## 2023-09-09 ENCOUNTER — Other Ambulatory Visit: Payer: Self-pay | Admitting: Family

## 2023-09-09 ENCOUNTER — Other Ambulatory Visit: Payer: Self-pay | Admitting: Internal Medicine

## 2023-09-09 ENCOUNTER — Encounter: Payer: Self-pay | Admitting: Dermatology

## 2023-09-09 ENCOUNTER — Other Ambulatory Visit: Payer: Self-pay

## 2023-09-09 ENCOUNTER — Other Ambulatory Visit: Payer: Self-pay | Admitting: Dermatology

## 2023-09-09 DIAGNOSIS — L309 Dermatitis, unspecified: Secondary | ICD-10-CM

## 2023-09-09 MED ORDER — PANTOPRAZOLE SODIUM 40 MG PO TBEC
40.0000 mg | DELAYED_RELEASE_TABLET | Freq: Every day | ORAL | 0 refills | Status: DC
  Filled 2023-09-09: qty 30, 30d supply, fill #0

## 2023-09-09 MED ORDER — MUPIROCIN 2 % EX OINT
1.0000 | TOPICAL_OINTMENT | Freq: Two times a day (BID) | CUTANEOUS | 0 refills | Status: DC
  Filled 2023-09-09: qty 22, 21d supply, fill #0

## 2023-09-09 MED ORDER — DOXYCYCLINE HYCLATE 100 MG PO TABS
100.0000 mg | ORAL_TABLET | Freq: Two times a day (BID) | ORAL | 0 refills | Status: DC
  Filled 2023-09-09: qty 14, 7d supply, fill #0

## 2023-09-09 NOTE — Telephone Encounter (Signed)
HI Cole James, can we please send a refill for the doxy .  Thanks!

## 2023-09-23 ENCOUNTER — Other Ambulatory Visit (HOSPITAL_COMMUNITY): Payer: Self-pay

## 2023-11-19 ENCOUNTER — Encounter: Payer: Self-pay | Admitting: Internal Medicine

## 2023-11-19 ENCOUNTER — Other Ambulatory Visit (HOSPITAL_COMMUNITY): Payer: Self-pay

## 2023-11-19 ENCOUNTER — Ambulatory Visit: Payer: Commercial Managed Care - PPO | Admitting: Internal Medicine

## 2023-11-19 VITALS — BP 126/80 | HR 69 | Temp 98.0°F | Resp 16 | Ht 68.0 in | Wt 197.5 lb

## 2023-11-19 DIAGNOSIS — L989 Disorder of the skin and subcutaneous tissue, unspecified: Secondary | ICD-10-CM | POA: Diagnosis not present

## 2023-11-19 DIAGNOSIS — L309 Dermatitis, unspecified: Secondary | ICD-10-CM

## 2023-11-19 MED ORDER — LIDOCAINE 4 % EX CREA
1.0000 | TOPICAL_CREAM | Freq: Two times a day (BID) | CUTANEOUS | 0 refills | Status: DC | PRN
  Filled 2023-11-19: qty 30, 3d supply, fill #0

## 2023-11-19 NOTE — Progress Notes (Addendum)
Subjective:    Patient ID: Cole Halsted, MD, male    DOB: Nov 10, 1960, 63 y.o.   MRN: [id number]  DOS:  11/19/2023 Type of visit - description: Acute  Approximately 6 weeks ago developed a rash at the right buttock, he applied Lamisil twice daily for about 6 weeks. The lesion was mildly pruritic, reports no pain at the area, no blisters.  4 weeks ago developed another lesion nearby, this particular lesion was extremely pruritic . Now the  lesions have coalesce   Denies systemic symptoms such as fever or chills. No nausea or vomiting. No tick bites.  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   Tear of MCL (medial collateral ligament) of knee    fluid aspiration 04/04/17   Umbilical hernia     Past Surgical History:  Procedure Laterality Date   COLONOSCOPY  2012   in [place]- 2 benign polyps per pt = we have re[ort but no path    HERNIA REPAIR     inguinal, age 41   INSERTION OF MESH N/A 04/11/2017   Procedure: INSERTION OF MESH;  Surgeon: Avel Peace, MD;  Location: [facility];  Service: General;  Laterality: N/A;   UMBILICAL HERNIA REPAIR N/A 04/11/2017   Procedure: UMBILICAL HERNIA REPAIR;  Surgeon: Avel Peace, MD;  Location: [facility];  Service: General;  Laterality: N/A;    Current Outpatient Medications  Medication Instructions   albuterol (VENTOLIN HFA) 108 (90 Base) MCG/ACT inhaler 1-2 puffs, Inhalation, Every 6 hours PRN   amoxicillin (AMOXIL) 875 mg, Oral, 2 times daily   azithromycin (ZITHROMAX Z-PAK) 250 MG tablet Take 2 tablets on day 1, then 1 tablet daily days 2 through 5   clotrimazole-betamethasone (LOTRISONE) cream 1 Application, Topical, 2 times daily, Apply topically the the hand and calf twice a day  for up to two weeks. Alternate with Tacrolimus   fexofenadine (ALLEGRA ALLERGY) 60 mg, Oral, 2 times daily   Fluocinolone Acetonide Body 0.01 % OIL Apply topically to the scalp and beard nightly. Let sit for one hour, wash out with [employer] Zinc shampoo   fluticasone (FLONASE) 50 MCG/ACT nasal spray Place 1 spray into both nostrils daily as needed for allergies or rhinitis.   montelukast (SINGULAIR) 10 mg, Oral, At bedtime PRN, *Must make appt for any further refills*   mupirocin ointment (BACTROBAN) 2 % Apply 1 Application topically 2 (two) times daily, to affected area on hand for 2-3 weeks.   pantoprazole (PROTONIX) 40 mg, Oral, Daily before breakfast   predniSONE (DELTASONE) 10 MG tablet Take 3 tabs daily x 3 days, 2 tabs daily x 3 days, 1 tab daily x 3 days   tacrolimus (PROTOPIC) 0.1 % ointment Apply topically to the itchy areas on the face twice a day for two weeks.   triamcinolone cream (KENALOG) 0.1 % Apply topically to the affected areas on the body twice a day for two weeks.       Objective:   Physical Exam BP 126/80   Pulse 69   Temp 98 F (36.7 C) (Oral)   Resp 16   Ht 5\' 8"  (1.727 m)   Wt 197 lb 8 oz (89.6  kg)   SpO2 96%   BMI 30.03 kg/m  General:   Well developed, NAD, BMI noted. HEENT:  Normocephalic . Face symmetric, atraumatic Skin: See picture from the right buttock, the area is indurated, no TTP, not warm. Neurologic:  alert & oriented X3.  Speech normal, gait appropriate for age and unassisted Psych--  Cognition and judgment appear intact.  Cooperative with normal attention span and concentration.  Behavior appropriate. No anxious or depressed appearing.      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 Current lesion is indurated and does not look like simple eczema.  He self treated it with antifungals topically for 6 weeks. Recommend not to apply any cream except lidocaine for itching control.  Let the dermatologist evaluate the lesion and see if he needs another biopsy. CT chest: Report from 07/24/2021 No change in size of dominant nodule in the LEFT lower lobe. CT at 12 months (from today's scan) is considered optional for low-risk patients, but is recommended for high-risk patients.  Patient stated he dicussed results with pulmonary at the time and no further CTs were recommended. Due for a CPX.

## 2023-11-19 NOTE — Patient Instructions (Addendum)
Front desk, needs a CPX February 11 should be ok   Allegra for itching  You can apply lidocaine twice daily as needed for itching  Please see your dermatologist regarding the rash

## 2023-11-20 NOTE — Assessment & Plan Note (Addendum)
Skin lesion The patient is having skin issues on and off for the last year, has seen dermatology, had skin biopsy. Current lesion is indurated and does not look like simple eczema.  He self treated it with antifungals topically for 6 weeks. Recommend not to apply any cream except lidocaine for itching control.  Let the dermatologist evaluate the lesion and see if he needs another biopsy. CT chest: Report from 07/24/2021 No change in size of dominant nodule in the LEFT lower lobe. CT at 12 months (from today's scan) is considered optional for low-risk patients, but is recommended for high-risk patients.  Patient stated he dicussed results with pulmonary at the time and no further CTs were recommended. Due for a CPX.

## 2023-12-03 ENCOUNTER — Encounter: Payer: Self-pay | Admitting: Internal Medicine

## 2023-12-03 ENCOUNTER — Other Ambulatory Visit (HOSPITAL_BASED_OUTPATIENT_CLINIC_OR_DEPARTMENT_OTHER): Payer: Self-pay

## 2023-12-03 ENCOUNTER — Ambulatory Visit: Payer: Commercial Managed Care - PPO | Admitting: Dermatology

## 2023-12-03 ENCOUNTER — Other Ambulatory Visit (HOSPITAL_COMMUNITY): Payer: Self-pay

## 2023-12-03 ENCOUNTER — Ambulatory Visit: Payer: Commercial Managed Care - PPO | Admitting: Internal Medicine

## 2023-12-03 VITALS — BP 126/80 | HR 63 | Temp 97.6°F | Resp 16 | Ht 68.0 in | Wt 194.5 lb

## 2023-12-03 DIAGNOSIS — L280 Lichen simplex chronicus: Secondary | ICD-10-CM

## 2023-12-03 DIAGNOSIS — Z0000 Encounter for general adult medical examination without abnormal findings: Secondary | ICD-10-CM | POA: Diagnosis not present

## 2023-12-03 DIAGNOSIS — L308 Other specified dermatitis: Secondary | ICD-10-CM

## 2023-12-03 DIAGNOSIS — L299 Pruritus, unspecified: Secondary | ICD-10-CM | POA: Diagnosis not present

## 2023-12-03 DIAGNOSIS — R21 Rash and other nonspecific skin eruption: Secondary | ICD-10-CM

## 2023-12-03 DIAGNOSIS — L309 Dermatitis, unspecified: Secondary | ICD-10-CM | POA: Diagnosis not present

## 2023-12-03 DIAGNOSIS — Z113 Encounter for screening for infections with a predominantly sexual mode of transmission: Secondary | ICD-10-CM | POA: Diagnosis not present

## 2023-12-03 DIAGNOSIS — Z23 Encounter for immunization: Secondary | ICD-10-CM

## 2023-12-03 LAB — LIPID PANEL
Cholesterol: 230 mg/dL — ABNORMAL HIGH (ref 0–200)
HDL: 41.1 mg/dL (ref >39.00)
LDL Cholesterol: 141 mg/dL — ABNORMAL HIGH (ref 0–99)
NonHDL: 189.29
Total CHOL/HDL Ratio: 6
Triglycerides: 239 mg/dL — ABNORMAL HIGH (ref 0.0–149.0)
VLDL: 47.8 mg/dL — ABNORMAL HIGH (ref 0.0–40.0)

## 2023-12-03 LAB — COMPREHENSIVE METABOLIC PANEL
ALT: 24 U/L (ref 0–53)
AST: 22 U/L (ref 0–37)
Albumin: 4.4 g/dL (ref 3.5–5.2)
Alkaline Phosphatase: 55 U/L (ref 39–117)
BUN: 16 mg/dL (ref 6–23)
CO2: 29 meq/L (ref 19–32)
Calcium: 9.1 mg/dL (ref 8.4–10.5)
Chloride: 105 meq/L (ref 96–112)
Creatinine, Ser: 1.09 mg/dL (ref 0.40–1.50)
GFR: 72.54 mL/min (ref >60.00)
Glucose, Bld: 95 mg/dL (ref 70–99)
Potassium: 4.2 meq/L (ref 3.5–5.1)
Sodium: 139 meq/L (ref 135–145)
Total Bilirubin: 0.8 mg/dL (ref 0.2–1.2)
Total Protein: 7 g/dL (ref 6.0–8.3)

## 2023-12-03 LAB — CBC WITH DIFFERENTIAL/PLATELET
Basophils Absolute: 0.1 10*3/uL (ref 0.0–0.1)
Basophils Relative: 0.9 % (ref 0.0–3.0)
Eosinophils Absolute: 0.1 10*3/uL (ref 0.0–0.7)
Eosinophils Relative: 2 % (ref 0.0–5.0)
HCT: 45 % (ref 39.0–52.0)
Hemoglobin: 15.3 g/dL (ref 13.0–17.0)
Lymphocytes Relative: 27.9 % (ref 12.0–46.0)
Lymphs Abs: 1.6 10*3/uL (ref 0.7–4.0)
MCHC: 34 g/dL (ref 30.0–36.0)
MCV: 88.8 fL (ref 78.0–100.0)
Monocytes Absolute: 0.5 10*3/uL (ref 0.1–1.0)
Monocytes Relative: 9.3 % (ref 3.0–12.0)
Neutro Abs: 3.3 10*3/uL (ref 1.4–7.7)
Neutrophils Relative %: 59.9 % (ref 43.0–77.0)
Platelets: 162 10*3/uL (ref 150.0–400.0)
RBC: 5.07 Mil/uL (ref 4.22–5.81)
RDW: 13.1 % (ref 11.5–15.5)
WBC: 5.6 10*3/uL (ref 4.0–10.5)

## 2023-12-03 LAB — PSA: PSA: 2.02 ng/mL (ref 0.10–4.00)

## 2023-12-03 MED ORDER — CLOTRIMAZOLE-BETAMETHASONE 1-0.05 % EX CREA
TOPICAL_CREAM | Freq: Two times a day (BID) | CUTANEOUS | 0 refills | Status: AC
  Filled 2023-12-03: qty 30, 14d supply, fill #0

## 2023-12-03 MED ORDER — AREXVY 120 MCG/0.5ML IM SUSR
0.5000 mL | Freq: Once | INTRAMUSCULAR | 0 refills | Status: AC
  Filled 2023-12-03: qty 0.5, 1d supply, fill #0

## 2023-12-03 MED ORDER — AZITHROMYCIN 250 MG PO TABS
ORAL_TABLET | ORAL | 0 refills | Status: AC
  Filled 2023-12-03: qty 6, 5d supply, fill #0

## 2023-12-03 MED ORDER — MUPIROCIN 2 % EX OINT
TOPICAL_OINTMENT | Freq: Two times a day (BID) | CUTANEOUS | 0 refills | Status: AC
  Filled 2023-12-03: qty 22, 10d supply, fill #0

## 2023-12-03 NOTE — Patient Instructions (Addendum)
   GO TO THE LAB : Get the blood work     Next visit with me in 1 year for a physical exam.   Please schedule it at the front desk       "Health Care Power of attorney" ,  "Living will" (Advance care planning documents)  If you already have a living will or healthcare power of attorney, is recommended you bring the copy to be scanned in your chart.   The document will be available to all the doctors you see in the system.  Advance care planning is a process that supports adults in  understanding and sharing their preferences regarding future medical care.  The patient's preferences are recorded in documents called Advance Directives and the can be modified at any time while the patient is in full mental capacity.   If you don't have one, please consider create one.      More information at: [email or web address]

## 2023-12-03 NOTE — Assessment & Plan Note (Signed)
Here for CPX   Other issues addressed today Asthma: Well-controlled.  Current meds as needed Skin lesion: See LOV, to see dermatology today. Cardiovascular risk: 11%, checking FLP, noting he had coronary calcium scoring of 0. Request Zithromax as [place] is [place], prescription sent, to be taken for traveler's diarrhea as needed. RTC 1 year

## 2023-12-03 NOTE — Patient Instructions (Addendum)
Patient Handout: Wound Care for Skin Biopsy Site  Taking Care of Your Skin Biopsy Site  Proper care of the biopsy site is essential for promoting healing and minimizing scarring. This handout provides instructions on how to care for your biopsy site to ensure optimal recovery.  1. Cleaning the Wound:  Clean the biopsy site daily with gentle soap and water. Gently pat the area dry with a clean, soft towel. Avoid harsh scrubbing or rubbing the area, as this can irritate the skin and delay healing.  2. Applying Aquaphor and Bandage:  After cleaning the wound, apply a thin layer of Aquaphor ointment to the biopsy site. Cover the area with a sterile bandage to protect it from dirt, bacteria, and friction. Change the bandage daily or as needed if it becomes soiled or wet.  3. Continued Care for One Week:  Repeat the cleaning, Aquaphor application, and bandaging process daily for one week following the biopsy procedure. Keeping the wound clean and moist during this initial healing period will help prevent infection and promote optimal healing.  4. Massaging Aquaphor into the Area:  ---After one week, discontinue the use of bandages but continue to apply Aquaphor to the biopsy site. ----Gently massage the Aquaphor into the area using circular motions. ---Massaging the skin helps to promote circulation and prevent the formation of scar tissue.   Additional Tips:  Avoid exposing the biopsy site to direct sunlight during the healing process, as this can cause hyperpigmentation or worsen scarring. If you experience any signs of infection, such as increased redness, swelling, warmth, or drainage from the wound, contact your healthcare provider immediately. Follow any additional instructions provided by your healthcare provider for caring for the biopsy site and managing any discomfort. Conclusion:  Taking proper care of your skin biopsy site is crucial for ensuring optimal healing and  minimizing scarring. By following these instructions for cleaning, applying Aquaphor, and massaging the area, you can promote a smooth and successful recovery. If you have any questions or concerns about caring for your biopsy site, don't hesitate to contact your healthcare provider for guidance.     Plan: Counseling I counseled the patient regarding the following: Skin care: Patient should bathe using lukewarm water with a mild cleanser and moisturize immediately after. Emollients should be applied at least 2-3 times daily. Avoid scented detergents or fabric softeners. Keep fingernails short. Avoid excessive hand washing. Expectations: The patient is aware that eczema is chronic in nature and can improve with moisturizers and topical steroids and worsen with stress, scented soaps, detergents, scratching, dry skin, changes in weather and skin infections. Contact office if: Eczema worsens or fails to improve despite several weeks of treatment; patient develops skin infections (such as: yellow honey colored crusts or cold sores).  I recommended the following: Moisturizers   The following medication counseling was provided: I discussed with the patient that prolonged use of topical steroids can result in the increased appearance of superficial blood vessels (telangiectasias), lightening (hypopigmentation) and thinning of the skin (atrophy). Patient understands to avoid using high potency steroids in skin folds, the groin or the face. The patient verbalized understanding of the proper use and possible adverse effects of topical steroids. All of the patient's questions and concerns were addressed.     Important Information  Due to recent changes in healthcare laws, you may see results of your pathology and/or laboratory studies on MyChart before the doctors have had a chance to review them. We understand that in some cases there  may be results that are confusing or concerning to you. Please understand  that not all results are received at the same time and often the doctors may need to interpret multiple results in order to provide you with the best plan of care or course of treatment. Therefore, we ask that you please give [place] 2 business days to thoroughly review all your results before contacting the office for clarification. Should we see a critical lab result, you will be contacted sooner.   If You Need Anything After Your Visit  If you have any questions or concerns for your doctor, please call our main line at [phone] If no one answers, please leave a voicemail as directed and we will return your call as soon as possible. Messages left after 4 pm will be answered the following business day.   You may also send [place] a message via MyChart. We typically respond to MyChart messages within 1-2 business days.  For prescription refills, please ask your pharmacy to contact our office. Our fax number is [phone].  If you have an urgent issue when the clinic is closed that cannot wait until the next business day, you can page your doctor at the number below.    Please note that while we do our best to be available for urgent issues outside of office hours, we are not available 24/7.   If you have an urgent issue and are unable to reach [place], you may choose to seek medical care at your doctor's office, retail clinic, urgent care center, or emergency room.  If you have a medical emergency, please immediately call 911 or go to the emergency department. In the event of inclement weather, please call our main line at [phone] for an update on the status of any delays or closures.  Dermatology Medication Tips: Please keep the boxes that topical medications come in in order to help keep track of the instructions about where and how to use these. Pharmacies typically print the medication instructions only on the boxes and not directly on the medication tubes.   If your medication is too  expensive, please contact our office at [phone] or send [place] a message through MyChart.   We are unable to tell what your co-pay for medications will be in advance as this is different depending on your insurance coverage. However, we may be able to find a substitute medication at lower cost or fill out paperwork to get insurance to cover a needed medication.   If a prior authorization is required to get your medication covered by your insurance company, please allow [place] 1-2 business days to complete this process.  Drug prices often vary depending on where the prescription is filled and some pharmacies may offer cheaper prices.  The website www.goodrx.com contains coupons for medications through different pharmacies. The prices here do not account for what the cost may be with help from insurance (it may be cheaper with your insurance), but the website can give you the price if you did not use any insurance.  - You can print the associated coupon and take it with your prescription to the pharmacy.  - You may also stop by our office during regular business hours and pick up a GoodRx coupon card.  - If you need your prescription sent electronically to a different pharmacy, notify our office through [facility] or by phone at [phone]

## 2023-12-03 NOTE — Progress Notes (Signed)
Subjective:    Patient ID: Cole Halsted, MD, male    DOB: 1961/08/11, 63 y.o.   MRN: [id number]  DOS:  12/03/2023 Type of visit - description: Here for CPX  Here for CPX. Feeling well.   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   Tear of MCL (medial collateral ligament) of knee    fluid aspiration 04/04/17   Umbilical hernia     Past Surgical History:  Procedure Laterality Date   COLONOSCOPY  2012   in [place]- 2 benign polyps per pt = we have re[ort but no path    HERNIA REPAIR     inguinal, age 54   INSERTION OF MESH N/A 04/11/2017   Procedure: INSERTION OF MESH;  Surgeon: Avel Peace, MD;  Location: [facility] [place];  Service: General;  Laterality: N/A;   UMBILICAL HERNIA REPAIR N/A 04/11/2017   Procedure: UMBILICAL HERNIA REPAIR;  Surgeon: Avel Peace, MD;  Location: [facility] [place];  Service: General;  Laterality: N/A;   Social History   Socioeconomic History   Marital status: Divorced    Spouse name: Not on file   Number of children: 2   Years of education: Not on file   Highest education level: Professional school degree (e.g., MD, DDS, DVM, JD)  Occupational History   Occupation: phyisician  Tobacco Use   Smoking status: Never   Smokeless tobacco: Never  Vaping Use   Vaping status: Never Used  Substance and Sexual Activity   Alcohol use: Yes    Alcohol/week: 4.0 standard drinks of alcohol    Types: 4 Shots of liquor per week    Comment: Social   Drug use: No   Sexual activity: Not on file  Other Topics Concern   Not on file  Social  History Narrative   Born in [place], live in [place] since childhood   Moved to [place] 2017   Son  [place]    Daughter 36 , MD, graduated , applying for [employer]: Manufacturing engineer      Social Drivers of Corporate investment banker Strain: Low Risk  (11/18/2023)   Overall Financial Resource Strain (CARDIA)    Difficulty of Paying Living Expenses: Not hard at all  Food Insecurity: No Food Insecurity (11/18/2023)   Hunger Vital Sign    Worried About Running Out of Food in the Last Year: Never true    Ran Out of Food in the Last Year: Never true  Transportation Needs: No Transportation Needs (11/18/2023)   PRAPARE - Administrator, Civil Service (Medical): No    Lack of Transportation (Non-Medical): No  Physical Activity: Sufficiently Active (11/18/2023)   Exercise Vital Sign    Days of Exercise per Week: 6 days    Minutes of Exercise per Session: 50 min  Stress: Stress Concern Present (11/18/2023)   [employer] of Occupational Health - Occupational Stress Questionnaire  Feeling of Stress : To some extent  Social Connections: Unknown (11/18/2023)   Social Connection and Isolation Panel [NHANES]    Frequency of Communication with Friends and Family: More than three times a week    Frequency of Social Gatherings with Friends and Family: More than three times a week    Attends Religious Services: Patient declined    Database administrator or Organizations: No    Attends Engineer, structural: Not on file    Marital Status: Divorced  Intimate Partner Violence: Unknown (10/23/2022)   Received from [employer], [facility]   HITS    Physically Hurt: Not on file    Insult or Talk Down To: Not on file    Threaten Physical Harm: Not on file    Scream or Curse: Not on file     Current Outpatient Medications  Medication Instructions   albuterol (VENTOLIN HFA) 108 (90 Base) MCG/ACT inhaler 1-2 puffs, Inhalation, Every 6 hours PRN    azithromycin (ZITHROMAX Z-PAK) 250 MG tablet Take 2 tablets (500 mg total) by mouth daily for 1 day, THEN 1 tablet (250 mg total) daily for 4 days.   clotrimazole-betamethasone (LOTRISONE) cream 1 Application, Topical, 2 times daily, Apply topically the the hand and calf twice a day for up to two weeks. Alternate with Tacrolimus   fexofenadine (ALLEGRA ALLERGY) 60 mg, Oral, 2 times daily   Fluocinolone Acetonide Body 0.01 % OIL Apply topically to the scalp and beard nightly. Let sit for one hour, wash out with DHS Zinc shampoo   fluticasone (FLONASE) 50 MCG/ACT nasal spray Place 1 spray into both nostrils daily as needed for allergies or rhinitis.   lidocaine (LMX) 4 % cream Apply topically 2 (two) times daily as needed for itching   montelukast (SINGULAIR) 10 mg, Oral, At bedtime PRN, *Must make appt for any further refills*   mupirocin ointment (BACTROBAN) 2 % Apply 1 Application topically 2 (two) times daily, to affected area on hand for 2-3 weeks.   pantoprazole (PROTONIX) 40 mg, Oral, Daily before breakfast   RSV vaccine recomb adjuvanted (AREXVY) 120 MCG/0.5ML injection 0.5 mLs, Intramuscular,  Once   tacrolimus (PROTOPIC) 0.1 % ointment Apply topically to the itchy areas on the face twice a day for two weeks.       Objective:   Physical Exam BP 126/80   Pulse 63   Temp 97.6 F (36.4 C) (Oral)   Resp 16   Ht 5\' 8"  (1.727 m)   Wt 194 lb 8 oz (88.2 kg)   SpO2 96%   BMI 29.57 kg/m  General: Well developed, NAD, BMI noted Neck: No  thyromegaly  HEENT:  Normocephalic . Face symmetric, atraumatic Lungs:  CTA B Normal respiratory effort, no intercostal retractions, no accessory muscle use. Heart: RRR,  no murmur.  Abdomen:  Not distended, soft, non-tender. No rebound or rigidity.   Lower extremities: no pretibial edema bilaterally  Skin: Exposed areas without rash. Not pale. Not jaundice Neurologic:  alert & oriented X3.  Speech normal, gait appropriate for age and  unassisted Strength symmetric and appropriate for age.  Psych: Cognition and judgment appear intact.  Cooperative with normal attention span and concentration.  Behavior appropriate. No anxious or depressed appearing.     Assessment    Problem list GERD Asthma-mild , worse as a child, cold induced  Allergies  H/o +PPD H/o urolithiasis , age 54 2010--->  CP, elevated BP-- r/o MI, cardiac cath (-) Coronary calcium score  01/2021: zero  PLAN: Here for CPX -Td 2018 - pnm 23: 2018.  PNM 20: Today (per guidelines, due to history of asthma needs a pneumonia shot). -s/p  shingrix x 2 -Had a flu shot and covid vax 08-2023 - likes to proceed w/ a RSV @ the pharmacy - CCS: Cscope 2010 wnl per pt. C-scope 01/2020, 10 years per letter - Prostate ca screening: No symptoms, check PSA. - Labs: CBC CMP hep C HIV lipid panel PSA RPR -Diet exercise: Doing great, exercises daily, watching his diet closely -CV RF: Calcium coronary score 0 last year. - Healthcare POA: Information provided Other issues addressed today Asthma: Well-controlled.  Current meds as needed Skin lesion: See LOV, to see dermatology today. Cardiovascular risk: 11%, checking FLP, noting he had coronary calcium scoring of 0. Request Zithromax as he is traveling abroad, prescription sent, to be taken for traveler's diarrhea as needed. RTC 1 year

## 2023-12-03 NOTE — Progress Notes (Signed)
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 He states he has been dealing with area on and off for the past year. Has used Lamisil cream in area  He used some topical lidocaine 4% in area states looks some better but will not go away. He has some photos of area.   Patient has history of eczema at right hand, right leg, and back, has used tmc 0.1 % cream bid for 2 weeks and alternated between tacrolimus ointment. He states he is not using triamcinolone much and use Aveeno eczema lotion daily to affected areas . Reports very itchy at night.   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  He reports there is still a bump at bx site. Right hand is still flared.     The patient has spots, moles and lesions to be evaluated, some may be new or changing and the patient may have concern these could be cancer.   The following portions of the chart were reviewed this encounter and updated as appropriate: medications, allergies, medical history  Review of Systems:  No other skin or systemic complaints except as noted in HPI or Assessment and Plan.  Objective  Well appearing patient in no apparent distress; mood and affect are within normal limits.   A focused examination was performed of the following areas: Back, right leg, right dorsum hand, right buttock  Relevant exam findings are noted in the Assessment and Plan.  Right buttock inferior 8 cm x 7 cm scaly erythematous plaque with central area of induration and central erosions right buttock superior 8 cm x 7 cm scaly erythematous plaque with central area of induration and central erosions   Assessment & Plan    Dermatitis Unspecified / Pruritic Skin Lesion Assessment: Patient presents with an expanding  pruritic skin lesion, initially a small dime-sized spot in September, now an 8 cm by 7 cm scaly erythematous plaque with central induration on the right buttocks. Previous treatment with Lamisil cream was ineffective. Differential diagnoses include nummular eczema, fungal infection, and trichinellosis(pt concerned about eating pork during a recent trip to [place]).   Plan:   Perform two skin biopsies: one from the indurated area and another from a separate scaly spot.   Prescribe clotrimazole-betamethasone cream, to be applied twice daily to affected areas until suture removal.   Schedule a follow-up appointment to review biopsy results.   Monitor for systemic symptoms including eye issues, chills, weakness, muscle weakness, abdominal pain, and joint pain.   Consider additional testing based on biopsy results, including eosinophil count, CK levels, and IgG levels.  RASH AND OTHER NONSPECIFIC SKIN ERUPTION Right buttock inferior, right buttock superior  r/o eczema vs tinea corporis vs trichinella  Skin / nail biopsy - right buttock superior Type of biopsy: punch   Informed consent: discussed and consent obtained   Timeout: patient name, date of birth, surgical site, and procedure verified   Patient was prepped and draped in usual sterile fashion: Area prepped with isopropyl alcohol. Anesthesia: the lesion was anesthetized in a standard fashion   Anesthetic:  1% lidocaine w/ epinephrine 1-100,000 buffered w/ 8.4% NaHCO3 Punch size:  3 mm Suture size:  4-0 Suture type: nylon   Suture removal (days):  14 Hemostasis achieved with: suture and aluminum chloride  Outcome: patient tolerated procedure well   Post-procedure details: wound care instructions given   Additional details:  Mupirocin and a dressing applied  Skin / nail biopsy - Right buttock inferior Type of biopsy: punch   Informed consent: discussed and consent obtained   Timeout: patient name, date of birth, surgical site, and  procedure verified   Patient was prepped and draped in usual sterile fashion: Area prepped with isopropyl alcohol. Anesthesia: the lesion was anesthetized in a standard fashion   Anesthetic:  1% lidocaine w/ epinephrine 1-100,000 buffered w/ 8.4% NaHCO3 Punch size:  3 mm Suture size:  4-0 Suture type: nylon   Suture removal (days):  14 Hemostasis achieved with: suture and aluminum chloride   Outcome: patient tolerated procedure well   Post-procedure details: wound care instructions given   Additional details:  Mupirocin and a dressing applied Specimen 1 - Surgical pathology Differential Diagnosis: r/o eczema vs tinea corporis vs trichinella   Check Margins: No  Specimen 2 - Surgical pathology Differential Diagnosis:  r/o eczema vs tinea corporis vs trichinella   Check Margins: No Related Medications clotrimazole-betamethasone (LOTRISONE) cream Apply topically 2 (two) times daily to affected areas for 2 weeks then discontinue. DERMATITIS, UNSPECIFIED   Related Medications mupirocin ointment (BACTROBAN) 2 % Apply topically 2 (two) times daily to affected area at buttock and other wounds as needed until healed.  Return for 2 week suture removal rash follow up.  I, Cole James, CMA, am acting as scribe for [employer], DO.   Documentation: I have reviewed the above documentation for accuracy and completeness, and I agree with the above.  Cole Reusing, DO

## 2023-12-03 NOTE — Assessment & Plan Note (Signed)
Here for CPX -Td 2018 - pnm 23: 2018.  PNM 20: Today (per guidelines, due to history of asthma needs a pneumonia shot). -s/p  shingrix x 2 -Had a flu shot and covid vax 08-2023 - likes to proceed w/ a RSV @ the pharmacy - CCS: Cscope 2010 wnl per pt. C-scope 01/2020, 10 years per letter - Prostate ca screening: No symptoms, check PSA. - Labs: CBC CMP hep C HIV lipid panel PSA RPR -Diet exercise: Doing great, exercises daily, watching his diet closely -CV RF: Calcium coronary score 0 last year. - Healthcare POA: Information provided

## 2023-12-04 LAB — HIV ANTIBODY (ROUTINE TESTING W REFLEX): HIV 1&2 Ab, 4th Generation: NONREACTIVE

## 2023-12-04 LAB — RPR: RPR Ser Ql: NONREACTIVE

## 2023-12-04 LAB — HEPATITIS C ANTIBODY: Hepatitis C Ab: NONREACTIVE

## 2023-12-05 ENCOUNTER — Encounter: Payer: Self-pay | Admitting: Internal Medicine

## 2023-12-05 LAB — SURGICAL PATHOLOGY

## 2023-12-17 ENCOUNTER — Ambulatory Visit: Payer: Commercial Managed Care - PPO | Admitting: Dermatology

## 2023-12-17 ENCOUNTER — Encounter: Payer: Self-pay | Admitting: Dermatology

## 2023-12-17 ENCOUNTER — Other Ambulatory Visit (HOSPITAL_COMMUNITY): Payer: Self-pay

## 2023-12-17 DIAGNOSIS — L219 Seborrheic dermatitis, unspecified: Secondary | ICD-10-CM

## 2023-12-17 DIAGNOSIS — L309 Dermatitis, unspecified: Secondary | ICD-10-CM

## 2023-12-17 DIAGNOSIS — L300 Nummular dermatitis: Secondary | ICD-10-CM | POA: Diagnosis not present

## 2023-12-17 MED ORDER — TACROLIMUS 0.1 % EX OINT
TOPICAL_OINTMENT | Freq: Two times a day (BID) | CUTANEOUS | 5 refills | Status: DC
  Filled 2023-12-17 (×2): qty 30, 30d supply, fill #0

## 2023-12-17 NOTE — Patient Instructions (Signed)

## 2023-12-17 NOTE — Progress Notes (Signed)
   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 Patient was last evaluated on 12/03/23. At this visit patient was prescribed Clotrimazole-betamethasone Tacrolimus and Mupirocin 2% . Patient reports sxs are better. Patient denies medication changes.  The following portions of the chart were reviewed this encounter and updated as appropriate: medications, allergies, medical history  Review of Systems:  No other skin or systemic complaints except as noted in HPI or Assessment and Plan.  Objective  Well appearing patient in no apparent distress; mood and affect are within normal limits.   A focused examination was performed of the following areas: hands & calf   Relevant exam findings are noted in the Assessment and Plan.    Assessment & Plan   NUMMULAR ECZEMA Exam: 90% resolved   Treatment Plan: - Reviewed bx results -D/C Clotrimazole-betamethasone. - Sample of Stratta scar gel given to pt to massage into affected areas to prevent possibilities of keloids. Use for 4-6weeks or until its flat. - Continue using Tacrolimus as needed for flares.    SEBORRHEIC DERMATITIS Exam: Pink patches with greasy scale at scalp has improved  stable  Treatment Plan: - Continue using DHS Zinc shampoo & DermaSmoothe scalp oil   SEBORRHEIC DERMATITIS   ECZEMA, UNSPECIFIED TYPE    Return if symptoms worsen or fail to improve.    Documentation: I have reviewed the above documentation for accuracy and completeness, and I agree with the above.   I, Shirron Marcha Solders, CMA, am acting as scribe for [employer], DO.   Langston Reusing, DO

## 2023-12-17 NOTE — Progress Notes (Signed)
 Bx results consistent with Dx of eczema.  No fungus or bacteria were found.  Pt has appointment today for SRM and to discuss results in detail.

## 2023-12-18 ENCOUNTER — Other Ambulatory Visit (HOSPITAL_COMMUNITY): Payer: Self-pay

## 2023-12-25 ENCOUNTER — Other Ambulatory Visit (HOSPITAL_COMMUNITY): Payer: Self-pay

## 2023-12-25 MED ORDER — MONTELUKAST SODIUM 10 MG PO TABS
10.0000 mg | ORAL_TABLET | Freq: Every evening | ORAL | 1 refills | Status: AC | PRN
  Filled 2023-12-25: qty 90, 90d supply, fill #0

## 2023-12-25 MED ORDER — PREDNISONE 10 MG PO TABS
ORAL_TABLET | ORAL | 0 refills | Status: AC
  Filled 2023-12-25: qty 20, 8d supply, fill #0

## 2023-12-25 MED ORDER — ALBUTEROL SULFATE HFA 108 (90 BASE) MCG/ACT IN AERS
1.0000 | INHALATION_SPRAY | Freq: Four times a day (QID) | RESPIRATORY_TRACT | 5 refills | Status: DC | PRN
Start: 2023-12-25 — End: 2024-09-11
  Filled 2023-12-25: qty 6.7, 25d supply, fill #0

## 2023-12-25 NOTE — Addendum Note (Signed)
 Addended byConrad Republic D on: 12/25/2023 01:01 PM   Modules accepted: Orders

## 2023-12-25 NOTE — Telephone Encounter (Signed)
 Okay to refill Singulair and albuterol

## 2023-12-25 NOTE — Telephone Encounter (Signed)
 Rxs sent

## 2024-02-18 DIAGNOSIS — M7701 Medial epicondylitis, right elbow: Secondary | ICD-10-CM | POA: Diagnosis not present

## 2024-02-18 DIAGNOSIS — G5621 Lesion of ulnar nerve, right upper limb: Secondary | ICD-10-CM | POA: Diagnosis not present

## 2024-03-03 DIAGNOSIS — S5401XA Injury of ulnar nerve at forearm level, right arm, initial encounter: Secondary | ICD-10-CM | POA: Diagnosis not present

## 2024-03-03 DIAGNOSIS — S66118A Strain of flexor muscle, fascia and tendon of other finger at wrist and hand level, initial encounter: Secondary | ICD-10-CM | POA: Diagnosis not present

## 2024-03-09 ENCOUNTER — Ambulatory Visit: Admitting: Orthopedic Surgery

## 2024-03-09 ENCOUNTER — Encounter: Admitting: Sports Medicine

## 2024-03-09 DIAGNOSIS — M7701 Medial epicondylitis, right elbow: Secondary | ICD-10-CM | POA: Diagnosis not present

## 2024-03-09 DIAGNOSIS — M25521 Pain in right elbow: Secondary | ICD-10-CM | POA: Diagnosis not present

## 2024-03-09 NOTE — Progress Notes (Signed)
 [place] Isle, MD - 63 y.o. male MRN [id number]  Date of birth: [birth year]  Office Visit Note: Visit Date: 03/09/2024 PCP: Ezell Hollow, MD Referred by: Ezell Hollow, MD  Subjective: No chief complaint on file.  HPI: Cole Isle, MD is a pleasant 63 y.o. male who presents today for evaluation of ongoing right elbow discomfort along the medial aspect has been present now for approximately 3 months.  He describes injury mechanism as working out in the gym on the butterfly machine when he noticed his significant pop at the right elbow with ongoing discomfort.  He was seen initially in the [facility] system, underwent elbow x-rays, however concern was raised at that time for potential subluxation of his right ulnar nerve.  He was subsequently given referral to hand surgery for evaluation.  Currently, he is denying any significant numbness or tingling to the right hand.  Pain is localized mostly to the medial aspect of the right elbow.  He does notice what he feels like may be an instability of the structure along the medial/posterior aspect of the right elbow with flexion or extension of the arm.  Pertinent ROS were reviewed with the patient and found to be negative unless otherwise specified above in HPI.   Visit Reason: right elbow pain, medial aspect Duration of symptoms: February 2025 Hand dominance: right Occupation: Diabetic: No Smoking: No Heart/Lung History: none Blood Thinners: none  Prior Testing/EMG:  none Injections (Date): none Treatments:none Prior Surgery: none  Assessment & Plan: Visit Diagnoses:  1. Medial epicondylitis of elbow, right     Plan: Based on clinical examination today, there is possibility for right elbow ulnar nerve subluxation versus potential triceps instability with snapping triceps.  Based on his injury mechanism, I am concerned about a potential triceps injury that is creating snapping of the triceps along the medial/posterior region of  the elbow.  In addition, he is not reporting any significant nerve type symptoms from the numbness and tingling standpoint.  We discussed imaging modalities ranging from ultrasound to MRI of the right elbow.  He does have a plate fixation of the right forearm from the past with titanium in this region, this may preclude our ability to obtain an adequate MRI study of the right elbow.  I discussed this patient with my partner Dr. Shauna Del who is offered to perform a dynamic ultrasound of the right elbow to better evaluate potential ulnar nerve subluxation versus snapping triceps.  Clinically, he is also describing signs and symptoms consistent with medial epicondylitis, we did agree that should this be the major finding on ultrasound, it would be appropriate to perform injection to the right medial epicondyle as well for symptom relief.  In addition, for the medial epicondylitis, I did fit him with a counterforce brace today to be utilized for symptom management.  He can follow-up with me in approximately 4 weeks or sooner as needed, I would like to review the results of the diagnostic dynamic ultrasound of the right elbow when complete.  Dr. Vaughn Georges has offered to perform this later today.    Follow-up: No follow-ups on file.   Meds & Orders: No orders of the defined types were placed in this encounter.  No orders of the defined types were placed in this encounter.    Procedures: No procedures performed      Clinical History: No specialty comments available.  He reports that he has never smoked. He has never used smokeless tobacco.  No results for input(s): "HGBA1C", "LABURIC" in the last 8760 hours.  Objective:   Vital Signs: There were no vitals taken for this visit.  Physical Exam  Gen: Well-appearing, in no acute distress; non-toxic CV: Regular Rate. Well-perfused. Warm.  Resp: Breathing unlabored on room air; no wheezing. Psych: Fluid speech in conversation; appropriate affect;  normal thought process  Ortho Exam Right upper extremity: - Notable tenderness over the medial epicondyle, pain with resisted wrist flexion and pain elicited with resisted flexion of the ring and small finger - Negative Tinel's over the cubital tunnel region, negative scratch collapse - There is evidence of subluxation structure along the medial posterior aspect of the elbow with flexion and extension, possible triceps - Sensation is intact distally throughout the hand including ulnar nerve distribution to light touch - No evidence of FDI wasting, negative Froment's  Imaging: No results found.  Past Medical/Family/Surgical/Social History: Medications & Allergies reviewed per EMR, new medications updated. 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   Tear of MCL (medial collateral ligament) of knee    fluid aspiration 04/04/17   Umbilical hernia    Family History  Problem Relation Age of Onset   Hypertension Father    CAD Father 48   Prostate cancer Maternal Grandfather 16   Cancer Paternal Grandfather        cholangio Ca   Colon cancer Neg Hx    Diabetes Neg Hx    Colon polyps Neg Hx    Rectal cancer Neg Hx    Stomach cancer Neg Hx    Past Surgical History:  Procedure Laterality Date   COLONOSCOPY  2012   in [place] - 2 benign polyps per pt = we have re[ort but no path    HERNIA REPAIR     inguinal, age 77   INSERTION OF MESH N/A  04/11/2017   Procedure: INSERTION OF MESH;  Surgeon: Adalberto Hollow, MD;  Location: [facility];  Service: General;  Laterality: N/A;   UMBILICAL HERNIA REPAIR N/A 04/11/2017   Procedure: UMBILICAL HERNIA REPAIR;  Surgeon: Adalberto Hollow, MD;  Location: [facility];  Service: General;  Laterality: N/A;   Social History   Occupational History   Occupation: phyisician  Tobacco Use   Smoking status: Never   Smokeless tobacco: Never  Vaping Use   Vaping status: Never Used  Substance and Sexual Activity   Alcohol use: Yes    Alcohol/week: 4.0 standard drinks of alcohol    Types: 4 Shots of liquor per week    Comment: Social   Drug use: No   Sexual activity: Not on file    Adaleen Hulgan Merlinda Starling) Marce Sensing, M.D. [facility], Hand Surgery

## 2024-03-11 ENCOUNTER — Encounter: Payer: Self-pay | Admitting: Sports Medicine

## 2024-03-11 ENCOUNTER — Other Ambulatory Visit: Payer: Self-pay

## 2024-03-11 ENCOUNTER — Ambulatory Visit: Admitting: Sports Medicine

## 2024-03-11 DIAGNOSIS — G8929 Other chronic pain: Secondary | ICD-10-CM | POA: Diagnosis not present

## 2024-03-11 DIAGNOSIS — G5621 Lesion of ulnar nerve, right upper limb: Secondary | ICD-10-CM

## 2024-03-11 DIAGNOSIS — M25521 Pain in right elbow: Secondary | ICD-10-CM

## 2024-03-11 NOTE — Progress Notes (Signed)
 [place] Isle, MD - 63 y.o. male MRN [id number]  Date of birth: 09/15/1961  Office Visit Note: Visit Date: 03/11/2024 PCP: Ezell Hollow, MD Referred by: Ezell Hollow, MD  Subjective: Chief Complaint  Patient presents with   Right Elbow - Pain   HPI: [place], MD is a pleasant 63 y.o. male who presents today for chronic right elbow pain with instability/snapping sensation. He is RHD.   Royalty (Dr. Quenton Bruns) has been experiencing ongoing right elbow pain and discomfort since February.  At that time he was hitting golf balls on the range for about 3 hours and then was working out in the gym on the butterfly machine when he was performing adduction and felt a significant pop in the right elbow.  Since this time he has had ongoing discomfort and at times can still feel a snapping or popping sensation in the elbow with vigorous activity (sometimes may feel 2 snaps).  He has only pain over the medial aspect of the elbow.  He denies pain into the distal forearm or the hands or fingers.  He denies any numbness or tingling.  Has seen Dr. Marilee Showers in the past to have concern for possible ulnar nerve subluxation.  Recently saw my partner, Dr. Merlinda Starling who was concerned for this versus snapping triceps syndrome and wanted further evaluation by myself with diagnostic ultrasound.  Not taking any consistent medication, Aleve as needed without significant improvement.  He did have a counterforce strap that Dr. Merlinda Starling gave him a few days ago and does feel more stable with this.  Pertinent ROS were reviewed with the patient and found to be negative unless otherwise specified above in HPI.   Assessment & Plan: Visit Diagnoses:  1. Chronic elbow pain, right   2. Lesion of right ulnar nerve    Plan: Impression is 47-month history of chronic right elbow pain which has both symptomatology and ultrasound imaging consistent with ulnar nerve subluxation.  Dynamic ultrasound today shows recurrent ulnar nerve  subluxation completely out of the groove with elbow flexion.  It is somewhat interesting that he is not having any numbness/tingling or specific ulnar neuritis, more so just pain.  He does have some mild inflammation near the medial epicondylitis as well, but I think this is more reciprocal.  He feels more stable in his counterforce brace.  I discussed with Jhonathan wearing this with physical activity.  Would avoid vigorous flexion-based exercises over the next few weeks start getting back into working out, golf/soccer, etc. with the counterforce brace in place to see if he is having any recurrent subluxating events.  He also can continue his PT at the gym with ulnar flossing and other stabilizing exercises.  Hopefully with this, he can lessen or possibly even resolve his subluxating events, although given the chronicity and the recurrent instability, I did discuss a surgical fix, such as ulnar nerve transposition may be the only permanent solution. I did discuss this information with Dr. Bryn Capuchin, and he will reach out to [facility] to discuss next steps.  Follow-up: Return for f/u with Dr. Merlinda Starling (he will reach out to patient).   Meds & Orders: No orders of the defined types were placed in this encounter.   Orders Placed This Encounter  Procedures   US  Extrem Up Right Ltd     Procedures: No procedures performed      Clinical History: No specialty comments available.  He reports that he has never smoked. He has  never used smokeless tobacco. No results for input(s): "HGBA1C", "LABURIC" in the last 8760 hours.  Objective:    Physical Exam  Gen: Well-appearing, in no acute distress; non-toxic CV: Well-perfused. Warm.  Resp: Breathing unlabored on room air; no wheezing. Psych: Fluid speech in conversation; appropriate affect; normal thought process  Ortho Exam - Right elbow: There is no significant redness swelling or effusion about the elbow joint.  There is full range of motion with flexion and  extension.  Endrange flexion does reexacerbated his pain as well as a palpable clicking/subluxating event.  There is no varus or valgus instability, negative milking maneuver and moving valgus stress test.  There is pain at the common flexor tendon origin with resisted wrist flexion although no gross weakness.  Negative Tinel's in the cubital tunnel.  Imaging: US  Extrem Up Right Ltd Result Date: 03/11/2024 Limited musculoskeletal ultrasound of the right upper extremity, right elbow was performed today.  Dilation of the medial epicondyle shows no significant cortical regularity.  The common flexor tendon origin was seen with proper insertion without any significant tearing.  There is a small calcification near the CFT insertion.  There is mild hyperemia in this location indicative of low-level inflammation/epicondylitis.  Evaluating within the ulnar groove shows the ulnar nerve well-seated within the tunnel with the arm in an extended position.  The ulnar nerve was evaluated dynamically from extension --> flexion with evidence of significant ulnar nerve subluxation completely out of the groove during flexion.  This was reproducible with recurrent elbow flexion maneuvers.  The medial head of the tricep was visualized during this flexion dynamic motion and I cannot appreciate any subluxation of the tricep itself.  Long axis view showed proper tricep insertion on the olecranon. IMPRESION: Dynamic ultrasound visualizing ulnar nerve subluxation     ^Elbow in flexion (ulnar nerve in groove - red arrow)    ^Elbow in extension (ulnar nerve subluxed out of groove - red arrow)  Past Medical/Family/Surgical/Social History: Medications & Allergies reviewed per EMR, new medications updated. 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   Tear of MCL (medial collateral ligament) of knee    fluid aspiration 04/04/17   Umbilical hernia    Family History  Problem Relation Age of Onset   Hypertension Father    CAD Father 75   Prostate cancer Maternal Grandfather 59   Cancer Paternal Grandfather        cholangio Ca   Colon cancer Neg Hx    Diabetes Neg Hx    Colon polyps Neg Hx    Rectal cancer Neg Hx    Stomach cancer Neg Hx    Past Surgical History:  Procedure Laterality Date   COLONOSCOPY  2012   in [place] - 2 benign polyps per pt = we have re[ort but no path    HERNIA REPAIR     inguinal, age 65   INSERTION  OF MESH N/A 04/11/2017   Procedure: INSERTION OF MESH;  Surgeon: Adalberto Hollow, MD;  Location: [facility];  Service: General;  Laterality: N/A;   UMBILICAL HERNIA REPAIR N/A 04/11/2017   Procedure: UMBILICAL HERNIA REPAIR;  Surgeon: Adalberto Hollow, MD;  Location: [facility];  Service: General;  Laterality: N/A;   Social History   Occupational History   Occupation: phyisician  Tobacco Use   Smoking status: Never   Smokeless tobacco: Never  Vaping Use   Vaping status: Never Used  Substance and Sexual Activity   Alcohol use: Yes    Alcohol/week: 4.0 standard drinks of alcohol    Types: 4 Shots of liquor per week    Comment: Social   Drug use: No   Sexual activity: Not on file

## 2024-03-11 NOTE — Progress Notes (Signed)
 Patient says that he hurt the medial aspect of his elbow at the end of February when using the pec fly machine at the gym. He says at that time he felt a pop and pain. He has not had any numbness or tingling and his pain his localized at the medial epicondyle. He does think he has had some swelling but denies any bruising. He is not taking medication for the elbow but has tried Aleve previously which gave him minimal relief. He also uses ice occasionally, which provides him minimal relief. He has pain with wrist and elbow flexion, but denies pain with wrist extension. He is able to make a fist without pain.

## 2024-04-16 ENCOUNTER — Encounter: Payer: Self-pay | Admitting: Orthopedic Surgery

## 2024-04-17 NOTE — Telephone Encounter (Signed)
 No, I do not. Per Dr. Hurley last ov note. Patient was going to has US  with Dr. Burnetta, and return to [facility] in 4 weeks. Ash wanted to review results.

## 2024-04-17 NOTE — Telephone Encounter (Signed)
 Do you have a sx sheet for him?

## 2024-05-04 ENCOUNTER — Ambulatory Visit: Admitting: Orthopedic Surgery

## 2024-05-04 DIAGNOSIS — G5621 Lesion of ulnar nerve, right upper limb: Secondary | ICD-10-CM | POA: Diagnosis not present

## 2024-05-04 NOTE — Progress Notes (Signed)
 Cole DELENA Dowdy, MD - 63 y.o. male MRN [id number]  Date of birth: Mar 02, 1961  Office Visit Note: Visit Date: 05/04/2024 PCP: Amon Aloysius BRAVO, MD Referred by: Amon Aloysius BRAVO, MD  Subjective: No chief complaint on file.  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 male who returns today for evaluation of ongoing right elbow discomfort along the medial aspect has been present now for approximately 3 months.  He was seen recently by Dr. Burnetta who performed dynamic ultrasound of the right elbow which did show significant subluxation of the ulnar nerve.  This is consistent with his ongoing symptoms of discomfort in this region particular with elbow flexion.  He continues to deny any significant numbness or tingling in the right hand, no significant weakness.  Pertinent ROS were reviewed with the patient and found to be negative unless otherwise specified above in HPI.   Visit Reason: right elbow pain, medial aspect Duration of symptoms: February 2025 Hand dominance: right Occupation: Diabetic: No Smoking: No Heart/Lung History: none Blood Thinners: none  Prior Testing/EMG:  none Injections (Date): none Treatments:none Prior Surgery: none  Assessment & Plan: Visit Diagnoses:  1. Impingement of ulnar nerve, right      Plan: Based on clinical and dynamic ultrasound, he has ongoing right elbow ulnar nerve subluxation with notable instability.  Given that his symptoms continue to persist with notable ulnar nerve instability, patient is indicated for right elbow cubital tunnel release with anterior nerve transposition.  Risks and benefits of the procedure were discussed, risks including but not limited to infection, bleeding, scarring, stiffness, nerve injury, tendon injury, vascular injury, nerve instability, recurrence of symptoms and need for subsequent operation.  We also discussed the appropriate postoperative protocol and timeframe for return to activities and function.  Patient  expressed understanding.  Will move forward with surgical scheduling for the next available date per patient preference.   Follow-up: No follow-ups on file.   Meds & Orders: No orders of the defined types were placed in this encounter.  No orders of the defined types were placed in this encounter.    Procedures: No procedures performed      Clinical History: No specialty comments available.  He reports that he has never smoked. He has never used smokeless tobacco. No results for input(s): HGBA1C, LABURIC in the last 8760 hours.  Objective:   Vital Signs: There were no vitals taken for this visit.  Physical Exam  Gen: Well-appearing, in no acute distress; non-toxic CV: Regular Rate. Well-perfused. Warm.  Resp: Breathing unlabored on room air; no wheezing. Psych: Fluid speech in conversation; appropriate affect; normal thought process  Ortho Exam Right upper extremity: - Notable tenderness over the medial epicondyle, mild pain with resisted wrist flexion and pain elicited with resisted flexion of the ring and small finger - Negative Tinel's over the cubital tunnel region - There is evidence of nerve subluxation structure along the medial posterior aspect of the elbow with flexion and extension - Sensation is intact distally throughout the hand including ulnar nerve distribution to light touch - No evidence of FDI wasting, negative Froment's  Imaging: No results found.  Past Medical/Family/Surgical/Social History: Medications & Allergies reviewed per EMR, new medications updated. 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   Tear of MCL (medial collateral ligament) of knee    fluid aspiration 04/04/17   Umbilical hernia    Family History  Problem Relation Age of Onset   Hypertension Father    CAD Father 59   Prostate cancer Maternal Grandfather 11   Cancer Paternal Grandfather        cholangio Ca   Colon cancer Neg Hx    Diabetes Neg Hx    Colon polyps Neg Hx    Rectal cancer Neg Hx    Stomach cancer Neg Hx    Past Surgical History:  Procedure Laterality Date   COLONOSCOPY  2012   in [place] - 2 benign polyps per pt = we have re[ort but no path    HERNIA REPAIR     inguinal, age 35   INSERTION OF MESH N/A 04/11/2017   Procedure: INSERTION OF MESH;  Surgeon: Lily Boas, MD;  Location: [facility];  Service: General;  Laterality: N/A;   UMBILICAL HERNIA REPAIR N/A 04/11/2017   Procedure: UMBILICAL HERNIA REPAIR;  Surgeon: Lily Boas, MD;  Location: [facility];  Service: General;  Laterality: N/A;   Social History   Occupational History   Occupation: phyisician  Tobacco Use   Smoking status: Never   Smokeless tobacco: Never  Vaping Use   Vaping status: Never Used  Substance and Sexual Activity   Alcohol use: Yes    Alcohol/week: 4.0 standard drinks of alcohol    Types: 4 Shots of liquor per week    Comment: Social   Drug use: No   Sexual activity: Not on file    Aislyn Hayse Estela) Arlinda, M.D. [facility], Hand Surgery

## 2024-05-05 ENCOUNTER — Encounter: Payer: Self-pay | Admitting: Internal Medicine

## 2024-05-07 NOTE — Telephone Encounter (Signed)
 FYI

## 2024-08-19 ENCOUNTER — Encounter: Payer: Self-pay | Admitting: Orthopedic Surgery

## 2024-08-19 ENCOUNTER — Telehealth: Payer: Self-pay | Admitting: Neurology

## 2024-08-19 NOTE — Telephone Encounter (Signed)
 LVM and sent mychart msg offering appointment to patient

## 2024-08-19 NOTE — Telephone Encounter (Signed)
 Please put him on my schedule for EMG/NCS on Nov 7th, 10:30am, arriving office at 10:20am

## 2024-08-20 ENCOUNTER — Other Ambulatory Visit: Payer: Self-pay | Admitting: Orthopedic Surgery

## 2024-08-20 DIAGNOSIS — G5621 Lesion of ulnar nerve, right upper limb: Secondary | ICD-10-CM

## 2024-08-24 ENCOUNTER — Encounter: Payer: Self-pay | Admitting: Radiology

## 2024-08-26 ENCOUNTER — Telehealth: Payer: Self-pay | Admitting: Neurology

## 2024-08-26 NOTE — Telephone Encounter (Signed)
 MYC conf

## 2024-08-28 ENCOUNTER — Ambulatory Visit: Admitting: Neurology

## 2024-08-28 ENCOUNTER — Ambulatory Visit: Payer: Self-pay

## 2024-08-28 ENCOUNTER — Encounter: Payer: Self-pay | Admitting: Neurology

## 2024-08-28 VITALS — BP 149/97 | HR 76

## 2024-08-28 DIAGNOSIS — M25521 Pain in right elbow: Secondary | ICD-10-CM | POA: Diagnosis not present

## 2024-08-28 DIAGNOSIS — R202 Paresthesia of skin: Secondary | ICD-10-CM | POA: Insufficient documentation

## 2024-08-28 NOTE — Progress Notes (Signed)
 EMG nerve conduction study August 28, 2024 was normal

## 2024-08-28 NOTE — Procedures (Signed)
 Full Name: Cole James Gender: Male MRN #: [id number] Date of Birth: 1961-02-25    Visit Date: 08/28/2024 10:48 Age: 63 Years Examining Physician: Onita Duos Height: 5 feet 8 inch History: 63 year old right male, had right elbow pain/injury in February 2025 during workout, there was a suspicious for right ulnar nerve dislocation.  He overall has improved symptoms over the past few months  Physical exam: Bilateral upper extremity motor strength is normal, including bilateral finger abduction, abductor pollicis brevis, sensory examination are intact.  Deep tendon reflexes were hypoactive and symmetric  Summary of the test, Nerve conduction study:  Bilateral ulnar sensory and motor responses were normal.  Electromyography: Selected needle examination of the upper extremity muscles were normal  Conclusion: This is a normal study, there is no electrodiagnostic evidence of right upper extremity focal neuropathy, in specific, there is no evidence of right ulnar neuropathy across right elbow.   ------------------------------- Duos Onita. M.D. Ph.D.   [facility] [address] [place], [place] [address] Tel: [phone] Fax: [phone]  Verbal informed consent was obtained from the patient, patient was informed of potential risk of procedure, including bruising, bleeding, hematoma formation, infection, muscle weakness, muscle pain, numbness, among others.        MNC    Nerve / Sites Muscle Latency Ref. Amplitude Ref. Rel Amp Segments Distance Velocity Ref. Area    ms ms mV mV %  cm m/s m/s mVms  R Median - APB     Wrist APB 3.7 <=4.4 4.5 >=4.0 100 Wrist - APB 7   16.7     Upper arm APB 7.7  4.6  101 Upper arm - Wrist 21 53 >=49 16.2  L Median - APB     Wrist APB 3.6 <=4.4 8.2 >=4.0 100 Wrist - APB 7   27.8     Upper arm APB 7.4  7.9  96.8 Upper arm - Wrist 22 58 >=49 27.9  R Ulnar - ADM     Wrist ADM 2.5 <=3.3 11.1 >=6.0 100 Wrist - ADM 7    35.3     B.Elbow ADM 4.6  10.9  98.2 B.Elbow - Wrist 15 69 >=49 35.8     A.Elbow ADM 6.8  10.5  97.1 A.Elbow - B.Elbow 13 62 >=49 35.8  L Ulnar - ADM     Wrist ADM 2.4 <=3.3 11.1 >=6.0 100 Wrist - ADM 7   31.4     B.Elbow ADM 5.0  10.5  94.1 B.Elbow - Wrist 13 52 >=49 31.9     A.Elbow ADM 7.4  9.8  94 A.Elbow - B.Elbow 12 50 >=49 31.8             SNC    Nerve / Sites Rec. Site Peak Lat Ref.  Amp Ref. Segments Distance    ms ms V V  cm  R Median - Orthodromic (Dig II, Mid palm)     Dig II Wrist 3.1 <=3.4 19 >=10 Dig II - Wrist 13  L Median - Orthodromic (Dig II, Mid palm)     Dig II Wrist 3.3 <=3.4 13 >=10 Dig II - Wrist 13  R Ulnar - Orthodromic, (Dig V, Mid palm)     Dig V Wrist 2.8 <=3.1 13 >=5 Dig V - Wrist 11  L Ulnar - Orthodromic, (Dig V, Mid palm)     Dig V Wrist 3.0 <=3.1 7 >=5 Dig V - Wrist 11  F  Wave    Nerve F Lat Ref.   ms ms  R Ulnar - ADM 26.8 <=32.0  L Ulnar - ADM 27.7 <=32.0         EMG Summary Table    Spontaneous MUAP Recruitment  Muscle IA Fib PSW Fasc Other Amp Dur. Poly Pattern  R. First dorsal interosseous Normal None None None _______ Normal Normal Normal Normal  R. Extensor digitorum communis Normal None None None _______ Normal Normal Normal Normal  R. Pronator teres Normal None None None _______ Normal Normal Normal Normal  R. Flexor digitorum profundus (Ulnar) Normal None None None _______ Normal Normal Normal Normal

## 2024-09-11 ENCOUNTER — Other Ambulatory Visit: Payer: Self-pay | Admitting: Family

## 2024-09-11 ENCOUNTER — Encounter: Payer: Self-pay | Admitting: Internal Medicine

## 2024-09-11 ENCOUNTER — Other Ambulatory Visit (HOSPITAL_COMMUNITY): Payer: Self-pay

## 2024-09-11 MED ORDER — AZITHROMYCIN 250 MG PO TABS
ORAL_TABLET | ORAL | 0 refills | Status: AC
  Filled 2024-09-11 (×2): qty 6, 5d supply, fill #0

## 2024-09-11 MED ORDER — ALBUTEROL SULFATE HFA 108 (90 BASE) MCG/ACT IN AERS
1.0000 | INHALATION_SPRAY | Freq: Four times a day (QID) | RESPIRATORY_TRACT | 5 refills | Status: AC | PRN
  Filled 2024-09-11 (×2): qty 6.7, 25d supply, fill #0

## 2024-09-12 ENCOUNTER — Other Ambulatory Visit (HOSPITAL_BASED_OUTPATIENT_CLINIC_OR_DEPARTMENT_OTHER): Payer: Self-pay

## 2024-09-14 ENCOUNTER — Other Ambulatory Visit (HOSPITAL_COMMUNITY): Payer: Self-pay

## 2024-09-14 MED ORDER — COVID-19 MRNA VAC-TRIS(PFIZER) 30 MCG/0.3ML IM SUSY
0.3000 mL | PREFILLED_SYRINGE | Freq: Once | INTRAMUSCULAR | 0 refills | Status: AC
  Filled 2024-09-14: qty 0.3, 1d supply, fill #0

## 2024-09-14 MED ORDER — DOXYCYCLINE HYCLATE 100 MG PO TABS
100.0000 mg | ORAL_TABLET | Freq: Two times a day (BID) | ORAL | 0 refills | Status: DC
  Filled 2024-09-14: qty 14, 7d supply, fill #0

## 2024-09-14 NOTE — Telephone Encounter (Signed)
 Hi Shirron,  Please send one refill of pt's doxy.  Thanks.

## 2024-10-07 ENCOUNTER — Encounter: Payer: Self-pay | Admitting: Internal Medicine

## 2024-10-13 ENCOUNTER — Encounter: Payer: Self-pay | Admitting: Internal Medicine

## 2024-10-13 ENCOUNTER — Ambulatory Visit: Admitting: Internal Medicine

## 2024-10-13 VITALS — BP 126/70 | HR 78 | Temp 98.0°F | Resp 16 | Ht 68.0 in | Wt 187.4 lb

## 2024-10-13 DIAGNOSIS — E785 Hyperlipidemia, unspecified: Secondary | ICD-10-CM | POA: Diagnosis not present

## 2024-10-13 DIAGNOSIS — E663 Overweight: Secondary | ICD-10-CM

## 2024-10-13 DIAGNOSIS — L309 Dermatitis, unspecified: Secondary | ICD-10-CM | POA: Diagnosis not present

## 2024-10-13 NOTE — Patient Instructions (Signed)
 I am glad you are doing well   go to the front desk for the checkout Please make an appointment for a physical exam for February 2026

## 2024-10-13 NOTE — Progress Notes (Signed)
 "  Subjective:    Patient ID: Cole DELENA Dowdy, MD, male    DOB: April 15, 1961, 63 y.o.   MRN: [id number]  DOS:  10/13/2024 Follow-up  Discussed the use of AI scribe software for clinical note transcription with the patient, who gave verbal consent to proceed.  History of Present Illness He is a 63 year old male who presents for a follow-up visit.  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 - Uses cortisone cream as needed, mainly during winter and spring flares  Allergic rhinitis and asthma symptoms - Allergic symptoms now milder - Rarely needs Allegra  - Uses Singulair  and Ventolin  as needed - Uses inhaler only two to three times per year    Review of Systems See above   Past Medical History:  Diagnosis Date   Allergy    pet dander, seasonal, apple, peach   Asthma    childhood   GERD (gastroesophageal reflux disease)    History of kidney stones    age 28   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   Tear of MCL (medial collateral ligament) of knee    fluid aspiration 04/04/17   Umbilical hernia     Past Surgical History:  Procedure Laterality Date   COLONOSCOPY  2012   in [place] - 2 benign polyps per pt = we have re[ort but no path    HERNIA REPAIR     inguinal, age 53   INSERTION OF MESH N/A 04/11/2017   Procedure: INSERTION OF MESH;  Surgeon: Lily Boas, MD;  Location: [facility] [place];  Service: General;  Laterality: N/A;   UMBILICAL HERNIA REPAIR N/A 04/11/2017   Procedure: UMBILICAL HERNIA REPAIR;  Surgeon: Lily Boas, MD;  Location: [facility];  Service: General;  Laterality: N/A;    Current  Outpatient Medications  Medication Instructions   albuterol  (VENTOLIN  HFA) 108 (90 Base) MCG/ACT inhaler 1-2 puffs, Inhalation, Every 6 hours PRN   clotrimazole -betamethasone  (LOTRISONE ) cream Apply topically 2 (two) times daily to affected areas for 2 weeks then discontinue.   doxycycline  (VIBRA -TABS) 100 mg, Oral, 2 times daily   fexofenadine  (ALLEGRA  ALLERGY) 60 mg, Oral, 2 times daily   fluticasone  (FLONASE ) 50 MCG/ACT nasal spray Place 1 spray into both nostrils daily as needed for allergies or rhinitis.   lidocaine  (LMX) 4 % cream Apply topically 2 (two) times daily as needed for itching   montelukast  (SINGULAIR ) 10 mg, Oral, At bedtime PRN   mupirocin  ointment (BACTROBAN ) 2 % Apply topically 2 (two) times daily to affected area at buttock and other wounds as needed until healed.   pantoprazole  (PROTONIX ) 40 mg, Oral, Daily before breakfast   tacrolimus  (PROTOPIC ) 0.1 % ointment Apply topically to the itchy areas on the face twice a day for two weeks.       Objective:   Physical Exam BP 126/70   Pulse 78   Temp 98 F (36.7 C) (Oral)   Resp 16   Ht 5' 8 (1.727 m)   Wt 187 lb 6 oz (85 kg)   SpO2 99%   BMI 28.49 kg/m  General:   Well developed, NAD, BMI noted. HEENT:  Normocephalic .  Face symmetric, atraumatic Lungs:  CTA B Normal respiratory effort, no intercostal retractions, no accessory muscle use. Heart: RRR,  no murmur.  Lower extremities: no pretibial edema bilaterally  Skin: Has a 1 inch in diameter circular patch of scaly slightly hyperpigmented skin at the right calf Neurologic:  alert & oriented X3.  Speech normal, gait appropriate for age and unassisted Psych--  Cognition and judgment appear intact.  Cooperative with normal attention span and concentration.  Behavior appropriate. No anxious or depressed appearing.      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  Needs a prescription to continue the program.  Sent. Hyperlipidemia Cholesterol levels previously borderline. Weight loss and lifestyle changes may have improved lipid profile. - Check cholesterol levels in February. Asthma Well-controlled with infrequent Ventolin  use, allergies improved. Eczema See physical exam, at this point he has a single lesion consistent with eczema, under the care of of dermatology who has previously performed biopsies Preventative care received flu and COVID vaccinations in October. Rec to schedule physical exam around March     "

## 2024-10-14 NOTE — Assessment & Plan Note (Signed)
 Mild dyslipidemia Patient has mild dyslipidemia, has been doing great following a program call  [employer] , lost 16 pounds, through diet and exercise.  Needs a prescription to continue the program.  Sent. Hyperlipidemia Cholesterol levels previously borderline. Weight loss and lifestyle changes may have improved lipid profile. - Check cholesterol levels in February. Asthma Well-controlled with infrequent Ventolin  use, allergies improved. Eczema See physical exam, at this point he has a single lesion consistent with eczema, under the care of of dermatology who has previously performed biopsies Preventative care received flu and COVID vaccinations in October. Rec to schedule physical exam around March

## 2024-10-27 ENCOUNTER — Encounter (INDEPENDENT_AMBULATORY_CARE_PROVIDER_SITE_OTHER): Payer: Self-pay

## 2025-01-12 ENCOUNTER — Encounter: Admitting: Internal Medicine
